# Patient Record
Sex: Female | Born: 1935 | Race: White | Hispanic: No | Smoking: Never smoker
Health system: Southern US, Community
[De-identification: ages and names within clinical notes are randomized; demographics above are authoritative.]

## PROBLEM LIST (undated history)

## (undated) DIAGNOSIS — I4891 Unspecified atrial fibrillation: Secondary | ICD-10-CM

## (undated) DIAGNOSIS — F329 Major depressive disorder, single episode, unspecified: Secondary | ICD-10-CM

## (undated) DIAGNOSIS — K219 Gastro-esophageal reflux disease without esophagitis: Secondary | ICD-10-CM

## (undated) DIAGNOSIS — F32A Depression, unspecified: Secondary | ICD-10-CM

## (undated) DIAGNOSIS — E039 Hypothyroidism, unspecified: Secondary | ICD-10-CM

## (undated) DIAGNOSIS — I10 Essential (primary) hypertension: Secondary | ICD-10-CM

## (undated) DIAGNOSIS — I350 Nonrheumatic aortic (valve) stenosis: Secondary | ICD-10-CM

## (undated) DIAGNOSIS — I2510 Atherosclerotic heart disease of native coronary artery without angina pectoris: Secondary | ICD-10-CM

## (undated) DIAGNOSIS — R296 Repeated falls: Secondary | ICD-10-CM

## (undated) DIAGNOSIS — I639 Cerebral infarction, unspecified: Secondary | ICD-10-CM

## (undated) DIAGNOSIS — E079 Disorder of thyroid, unspecified: Secondary | ICD-10-CM

## (undated) DIAGNOSIS — N390 Urinary tract infection, site not specified: Secondary | ICD-10-CM

## (undated) HISTORY — PX: TONSILLECTOMY: SUR1361

## (undated) HISTORY — PX: CORONARY ANGIOPLASTY WITH STENT PLACEMENT: SHX49

## (undated) HISTORY — PX: ABDOMINAL HYSTERECTOMY: SHX81

---

## 2016-02-12 ENCOUNTER — Emergency Department (HOSPITAL_COMMUNITY): Payer: Medicare Other

## 2016-02-12 ENCOUNTER — Emergency Department (HOSPITAL_COMMUNITY)
Admission: EM | Admit: 2016-02-12 | Discharge: 2016-02-12 | Disposition: A | Discharge status: Home / Self Care | Payer: Medicare Other | Source: Home / Self Care | Attending: Emergency Medicine | Admitting: Emergency Medicine

## 2016-02-12 DIAGNOSIS — Z7901 Long term (current) use of anticoagulants: Secondary | ICD-10-CM | POA: Insufficient documentation

## 2016-02-12 DIAGNOSIS — S0181XA Laceration without foreign body of other part of head, initial encounter: Secondary | ICD-10-CM | POA: Insufficient documentation

## 2016-02-12 DIAGNOSIS — W19XXXA Unspecified fall, initial encounter: Secondary | ICD-10-CM

## 2016-02-12 DIAGNOSIS — S0990XA Unspecified injury of head, initial encounter: Secondary | ICD-10-CM

## 2016-02-12 DIAGNOSIS — Y939 Activity, unspecified: Secondary | ICD-10-CM

## 2016-02-12 DIAGNOSIS — Y92129 Unspecified place in nursing home as the place of occurrence of the external cause: Secondary | ICD-10-CM | POA: Insufficient documentation

## 2016-02-12 DIAGNOSIS — R296 Repeated falls: Secondary | ICD-10-CM

## 2016-02-12 DIAGNOSIS — Y999 Unspecified external cause status: Secondary | ICD-10-CM | POA: Insufficient documentation

## 2016-02-12 DIAGNOSIS — S0191XA Laceration without foreign body of unspecified part of head, initial encounter: Secondary | ICD-10-CM

## 2016-02-12 HISTORY — DX: Repeated falls: R29.6

## 2016-02-12 LAB — BASIC METABOLIC PANEL
ANION GAP: 4 — AB (ref 5–15)
BUN: 15 mg/dL (ref 6–20)
CHLORIDE: 105 mmol/L (ref 101–111)
CO2: 28 mmol/L (ref 22–32)
Calcium: 9.2 mg/dL (ref 8.9–10.3)
Creatinine, Ser: 0.93 mg/dL (ref 0.44–1.00)
GFR calc non Af Amer: 52 mL/min — ABNORMAL LOW (ref >60)
GFR, EST AFRICAN AMERICAN: 60 mL/min — AB (ref >60)
Glucose, Bld: 118 mg/dL — ABNORMAL HIGH (ref 65–99)
POTASSIUM: 3.8 mmol/L (ref 3.5–5.1)
SODIUM: 137 mmol/L (ref 135–145)

## 2016-02-12 LAB — CBC
HEMATOCRIT: 37.3 % (ref 36.0–46.0)
Hemoglobin: 12.2 g/dL (ref 12.0–15.0)
MCH: 31.3 pg (ref 26.0–34.0)
MCHC: 32.7 g/dL (ref 30.0–36.0)
MCV: 95.6 fL (ref 78.0–100.0)
Platelets: 247 10*3/uL (ref 150–400)
RBC: 3.9 MIL/uL (ref 3.87–5.11)
RDW: 13.4 % (ref 11.5–15.5)
WBC: 9.9 10*3/uL (ref 4.0–10.5)

## 2016-02-12 LAB — I-STAT TROPONIN, ED: Troponin i, poc: 0.02 ng/mL (ref 0.00–0.08)

## 2016-02-12 MED ORDER — LIDOCAINE-EPINEPHRINE (PF) 2 %-1:200000 IJ SOLN
10.0000 mL | Freq: Once | INTRAMUSCULAR | Status: AC
  Administered 2016-02-12: 10 mL via INTRADERMAL
  Filled 2016-02-12: qty 20

## 2016-02-12 MED ORDER — ACETAMINOPHEN 325 MG PO TABS
650.0000 mg | ORAL_TABLET | Freq: Once | ORAL | Status: AC
  Administered 2016-02-12: 650 mg via ORAL
  Filled 2016-02-12: qty 2

## 2016-02-12 MED ORDER — ONDANSETRON HCL 4 MG/2ML IJ SOLN
2.0000 mg | Freq: Once | INTRAMUSCULAR | Status: AC
  Administered 2016-02-12: 2 mg via INTRAVENOUS
  Filled 2016-02-12: qty 2

## 2016-02-12 NOTE — Discharge Instructions (Signed)
Return for reevaluation if worsening headache or change in mental status. Please check on patient every 1-2 hours. Staples out of laceration in one week.

## 2016-02-12 NOTE — ED Triage Notes (Signed)
Pt here from nursing home with c/o fall , pt had lac to the back right of her head and left hip pain but landed onm the right side  No loc

## 2016-02-12 NOTE — ED Notes (Signed)
Pt able to stand with max assist , daughter states that is the pt.'s normal

## 2016-02-12 NOTE — ED Notes (Signed)
Pt transported to xray 

## 2016-02-12 NOTE — ED Notes (Signed)
Cleaned wound on back of head. Lac aprox. 2cm.

## 2016-02-12 NOTE — ED Provider Notes (Signed)
[facility]-EMERGENCY DEPT Provider Note   CSN: [id number] Arrival date & time: 02/12/16  1028     History   Chief Complaint Chief Complaint  Patient presents with  . Fall    HPI Mindy Baker is a 82 y.o. female.  HPI  82 year old female with unwitnessed fall at assisted living care facility. Daughter reports did not fall the previous assisted living care facility. [facility] at [facility]. Patient states that she does not remember what happened. She was found on the ground. She has a laceration to back of her head and bleeding is noted here. Patient complains of some back pain. She has a history of chronic back pain. Daughter is also present at bedside and states that she has recently had a pelvic fracture.  No past medical history on file.  There are no active problems to display for this patient.   No past surgical history on file.  OB History    No data available       Home Medications    Prior to Admission medications   Not on File    Family History No family history on file.  Social History Social History  Substance Use Topics  . Smoking status: Not on file  . Smokeless tobacco: Not on file  . Alcohol use Not on file     Allergies   Patient has no allergy information on record.   Review of Systems Review of Systems  All other systems reviewed and are negative.    Physical Exam Updated Vital Signs BP 144/86 (BP Location: Right Arm)   Pulse 88   Temp 97.4 F (36.3 C) (Oral)   Resp 23   SpO2 100%   Physical Exam  Constitutional: She appears well-developed and well-nourished.  HENT:  Head: Normocephalic.  Bleeding from back of head  Eyes: EOM are normal. Pupils are equal, round, and reactive to light.  Neck: Normal range of motion.  Cardiovascular: Normal rate.   Nursing note and vitals reviewed.    ED Treatments / Results  Labs (all labs ordered are listed, but only abnormal results are displayed) Labs Reviewed  CBC  BASIC  METABOLIC PANEL  I-STAT TROPOININ, ED    EKG  EKG Interpretation  Date/Time:  Monday February 12 2016 10:35:27 EST Ventricular Rate:  100 PR Interval:    QRS Duration: 80 QT Interval:  402 QTC Calculation: 503 R Axis:   -34 Text Interpretation:  Atrial fibrillation Inferior infarct, old Anteroseptal infarct, old Lateral leads are also involved Prolonged QT interval Baseline wander in lead(s) V6 No old tracing to compare Confirmed by Aramis Zobel MD, Duwayne HeckANIELLE [phone]) on 02/12/2016 11:26:29 AM       Radiology No results found.  Procedures Procedures (including critical care time)  Medications Ordered in ED Medications - No data to display   Initial Impression / Assessment and Plan / ED Course  I have reviewed the triage vital signs and the nursing notes.  Pertinent labs & imaging results that were available during my care of the patient were reviewed by me and considered in my medical decision making (see chart for details).  Clinical Course     Patient transfers only.  Discussed return precautions with daughter and will place on d/c instructions.   Final Clinical Impressions(s) / ED Diagnoses   Final diagnoses:  Fall, initial encounter  Traumatic head injury with multiple lacerations, initial encounter  Chronic anticoagulation    New Prescriptions New Prescriptions   No medications on file  Margarita Grizzleanielle Joel Cowin, MD 02/12/16 [phone]

## 2016-02-12 NOTE — ED Notes (Signed)
Awaiting ptar 

## 2016-02-13 ENCOUNTER — Emergency Department (HOSPITAL_COMMUNITY): Payer: Medicare Other

## 2016-02-13 ENCOUNTER — Inpatient Hospital Stay (HOSPITAL_COMMUNITY)
Admission: EM | Admit: 2016-02-13 | Discharge: 2016-02-17 | DRG: 872 | Disposition: A | Discharge status: Skilled Nursing Facility | Payer: Medicare Other | Attending: Internal Medicine | Admitting: Internal Medicine

## 2016-02-13 ENCOUNTER — Encounter (HOSPITAL_COMMUNITY): Payer: Self-pay | Admitting: Emergency Medicine

## 2016-02-13 DIAGNOSIS — N390 Urinary tract infection, site not specified: Secondary | ICD-10-CM | POA: Diagnosis present

## 2016-02-13 DIAGNOSIS — W19XXXA Unspecified fall, initial encounter: Secondary | ICD-10-CM | POA: Diagnosis present

## 2016-02-13 DIAGNOSIS — I4891 Unspecified atrial fibrillation: Secondary | ICD-10-CM | POA: Diagnosis present

## 2016-02-13 DIAGNOSIS — A419 Sepsis, unspecified organism: Secondary | ICD-10-CM | POA: Diagnosis not present

## 2016-02-13 DIAGNOSIS — Z9861 Coronary angioplasty status: Secondary | ICD-10-CM

## 2016-02-13 DIAGNOSIS — Z66 Do not resuscitate: Secondary | ICD-10-CM | POA: Diagnosis present

## 2016-02-13 DIAGNOSIS — Z7901 Long term (current) use of anticoagulants: Secondary | ICD-10-CM

## 2016-02-13 DIAGNOSIS — Z8673 Personal history of transient ischemic attack (TIA), and cerebral infarction without residual deficits: Secondary | ICD-10-CM

## 2016-02-13 DIAGNOSIS — N3000 Acute cystitis without hematuria: Secondary | ICD-10-CM | POA: Diagnosis present

## 2016-02-13 DIAGNOSIS — M436 Torticollis: Secondary | ICD-10-CM | POA: Diagnosis present

## 2016-02-13 DIAGNOSIS — W1830XA Fall on same level, unspecified, initial encounter: Secondary | ICD-10-CM | POA: Diagnosis present

## 2016-02-13 DIAGNOSIS — M810 Age-related osteoporosis without current pathological fracture: Secondary | ICD-10-CM | POA: Diagnosis present

## 2016-02-13 DIAGNOSIS — B958 Unspecified staphylococcus as the cause of diseases classified elsewhere: Secondary | ICD-10-CM | POA: Diagnosis present

## 2016-02-13 DIAGNOSIS — I10 Essential (primary) hypertension: Secondary | ICD-10-CM | POA: Diagnosis present

## 2016-02-13 DIAGNOSIS — F0390 Unspecified dementia without behavioral disturbance: Secondary | ICD-10-CM | POA: Diagnosis present

## 2016-02-13 DIAGNOSIS — I350 Nonrheumatic aortic (valve) stenosis: Secondary | ICD-10-CM | POA: Diagnosis present

## 2016-02-13 DIAGNOSIS — Y92099 Unspecified place in other non-institutional residence as the place of occurrence of the external cause: Secondary | ICD-10-CM

## 2016-02-13 DIAGNOSIS — Z955 Presence of coronary angioplasty implant and graft: Secondary | ICD-10-CM

## 2016-02-13 DIAGNOSIS — R51 Headache: Secondary | ICD-10-CM | POA: Diagnosis present

## 2016-02-13 DIAGNOSIS — I2510 Atherosclerotic heart disease of native coronary artery without angina pectoris: Secondary | ICD-10-CM

## 2016-02-13 HISTORY — DX: Repeated falls: R29.6

## 2016-02-13 HISTORY — DX: Major depressive disorder, single episode, unspecified: F32.9

## 2016-02-13 HISTORY — DX: Unspecified atrial fibrillation: I48.91

## 2016-02-13 HISTORY — DX: Urinary tract infection, site not specified: N39.0

## 2016-02-13 HISTORY — DX: Hypothyroidism, unspecified: E03.9

## 2016-02-13 HISTORY — DX: Gastro-esophageal reflux disease without esophagitis: K21.9

## 2016-02-13 HISTORY — DX: Nonrheumatic aortic (valve) stenosis: I35.0

## 2016-02-13 HISTORY — DX: Cerebral infarction, unspecified: I63.9

## 2016-02-13 HISTORY — DX: Depression, unspecified: F32.A

## 2016-02-13 HISTORY — DX: Atherosclerotic heart disease of native coronary artery without angina pectoris: I25.10

## 2016-02-13 HISTORY — DX: Essential (primary) hypertension: I10

## 2016-02-13 HISTORY — DX: Disorder of thyroid, unspecified: E07.9

## 2016-02-13 LAB — URINALYSIS, ROUTINE W REFLEX MICROSCOPIC
Bilirubin Urine: NEGATIVE
Glucose, UA: NEGATIVE mg/dL
Hgb urine dipstick: NEGATIVE
KETONES UR: 5 mg/dL — AB
Nitrite: POSITIVE — AB
PROTEIN: NEGATIVE mg/dL
Specific Gravity, Urine: 1.02 (ref 1.005–1.030)
pH: 5 (ref 5.0–8.0)

## 2016-02-13 LAB — COMPREHENSIVE METABOLIC PANEL
ALBUMIN: 3 g/dL — AB (ref 3.5–5.0)
ALK PHOS: 50 U/L (ref 38–126)
ALT: 13 U/L — AB (ref 14–54)
ANION GAP: 9 (ref 5–15)
AST: 27 U/L (ref 15–41)
BUN: 17 mg/dL (ref 6–20)
CALCIUM: 8.8 mg/dL — AB (ref 8.9–10.3)
CO2: 21 mmol/L — AB (ref 22–32)
CREATININE: 0.95 mg/dL (ref 0.44–1.00)
Chloride: 105 mmol/L (ref 101–111)
GFR calc non Af Amer: 50 mL/min — ABNORMAL LOW (ref >60)
GFR, EST AFRICAN AMERICAN: 58 mL/min — AB (ref >60)
GLUCOSE: 102 mg/dL — AB (ref 65–99)
Potassium: 4.1 mmol/L (ref 3.5–5.1)
SODIUM: 135 mmol/L (ref 135–145)
TOTAL PROTEIN: 5.6 g/dL — AB (ref 6.5–8.1)
Total Bilirubin: 1 mg/dL (ref 0.3–1.2)

## 2016-02-13 LAB — CBC WITH DIFFERENTIAL/PLATELET
Basophils Absolute: 0 10*3/uL (ref 0.0–0.1)
Basophils Relative: 0 %
EOS ABS: 0.2 10*3/uL (ref 0.0–0.7)
Eosinophils Relative: 2 %
HEMATOCRIT: 36.4 % (ref 36.0–46.0)
HEMOGLOBIN: 12 g/dL (ref 12.0–15.0)
LYMPHS ABS: 1.3 10*3/uL (ref 0.7–4.0)
LYMPHS PCT: 14 %
MCH: 31.8 pg (ref 26.0–34.0)
MCHC: 33 g/dL (ref 30.0–36.0)
MCV: 96.6 fL (ref 78.0–100.0)
MONOS PCT: 8 %
Monocytes Absolute: 0.7 10*3/uL (ref 0.1–1.0)
NEUTROS ABS: 7.4 10*3/uL (ref 1.7–7.7)
NEUTROS PCT: 76 %
Platelets: 228 10*3/uL (ref 150–400)
RBC: 3.77 MIL/uL — AB (ref 3.87–5.11)
RDW: 13.5 % (ref 11.5–15.5)
WBC: 9.6 10*3/uL (ref 4.0–10.5)

## 2016-02-13 LAB — LIPASE, BLOOD: Lipase: 17 U/L (ref 11–51)

## 2016-02-13 MED ORDER — NITROGLYCERIN 0.4 MG SL SUBL
0.4000 mg | SUBLINGUAL_TABLET | SUBLINGUAL | Status: DC | PRN
Start: 2016-02-13 — End: 2016-02-17

## 2016-02-13 MED ORDER — VITAMIN D 1000 UNITS PO TABS
1000.0000 [IU] | ORAL_TABLET | Freq: Every day | ORAL | Status: DC
  Administered 2016-02-14 – 2016-02-17 (×4): 1000 [IU] via ORAL
  Filled 2016-02-13 (×4): qty 1

## 2016-02-13 MED ORDER — CALCIUM CARBONATE-VITAMIN D 500-200 MG-UNIT PO TABS
1.0000 | ORAL_TABLET | Freq: Every day | ORAL | Status: DC
  Administered 2016-02-14 – 2016-02-15 (×2): 1 via ORAL
  Filled 2016-02-13 (×2): qty 1

## 2016-02-13 MED ORDER — POLYETHYLENE GLYCOL 3350 17 G PO PACK
17.0000 g | PACK | Freq: Every day | ORAL | Status: DC
  Administered 2016-02-14 – 2016-02-17 (×4): 17 g via ORAL
  Filled 2016-02-13 (×4): qty 1

## 2016-02-13 MED ORDER — ADULT MULTIVITAMIN W/MINERALS CH
1.0000 | ORAL_TABLET | Freq: Every day | ORAL | Status: DC
  Administered 2016-02-14 – 2016-02-15 (×2): 1 via ORAL
  Filled 2016-02-13 (×2): qty 1

## 2016-02-13 MED ORDER — ACETAMINOPHEN 500 MG PO TABS
500.0000 mg | ORAL_TABLET | Freq: Three times a day (TID) | ORAL | Status: DC | PRN
  Administered 2016-02-13 – 2016-02-17 (×5): 500 mg via ORAL
  Filled 2016-02-13 (×7): qty 1

## 2016-02-13 MED ORDER — LEVOTHYROXINE SODIUM 50 MCG PO TABS
50.0000 ug | ORAL_TABLET | Freq: Every day | ORAL | Status: DC
  Administered 2016-02-13 – 2016-02-17 (×5): 50 ug via ORAL
  Filled 2016-02-13 (×5): qty 1

## 2016-02-13 MED ORDER — SENNOSIDES-DOCUSATE SODIUM 8.6-50 MG PO TABS
1.0000 | ORAL_TABLET | Freq: Every day | ORAL | Status: DC
  Administered 2016-02-14 – 2016-02-17 (×4): 1 via ORAL
  Filled 2016-02-13 (×4): qty 1

## 2016-02-13 MED ORDER — CLOPIDOGREL BISULFATE 75 MG PO TABS
75.0000 mg | ORAL_TABLET | Freq: Every day | ORAL | Status: DC
  Administered 2016-02-13 – 2016-02-17 (×5): 75 mg via ORAL
  Filled 2016-02-13 (×5): qty 1

## 2016-02-13 MED ORDER — VITAMIN B-12 100 MCG PO TABS
500.0000 ug | ORAL_TABLET | Freq: Every day | ORAL | Status: DC
  Administered 2016-02-14 – 2016-02-17 (×4): 500 ug via ORAL
  Filled 2016-02-13 (×4): qty 5

## 2016-02-13 MED ORDER — DILTIAZEM HCL ER COATED BEADS 180 MG PO CP24
180.0000 mg | ORAL_CAPSULE | Freq: Every day | ORAL | Status: DC
  Administered 2016-02-13 – 2016-02-17 (×5): 180 mg via ORAL
  Filled 2016-02-13 (×7): qty 1

## 2016-02-13 MED ORDER — DEXTROSE 5 % IV SOLN
1.0000 g | Freq: Once | INTRAVENOUS | Status: AC
  Administered 2016-02-13: 1 g via INTRAVENOUS
  Filled 2016-02-13: qty 10

## 2016-02-13 MED ORDER — SODIUM CHLORIDE 0.9 % IV BOLUS (SEPSIS)
1000.0000 mL | Freq: Once | INTRAVENOUS | Status: AC
  Administered 2016-02-13: 1000 mL via INTRAVENOUS

## 2016-02-13 MED ORDER — DEXTROSE 5 % IV SOLN
1.0000 g | INTRAVENOUS | Status: DC
  Administered 2016-02-14 – 2016-02-15 (×2): 1 g via INTRAVENOUS
  Filled 2016-02-13 (×3): qty 10

## 2016-02-13 MED ORDER — SODIUM CHLORIDE 0.9 % IV SOLN: INTRAVENOUS | Status: AC

## 2016-02-13 MED ORDER — RIVAROXABAN 15 MG PO TABS
15.0000 mg | ORAL_TABLET | Freq: Every day | ORAL | Status: DC
  Administered 2016-02-13 – 2016-02-16 (×4): 15 mg via ORAL
  Filled 2016-02-13 (×4): qty 1

## 2016-02-13 NOTE — ED Notes (Signed)
Clean patient up pt bed was wet

## 2016-02-13 NOTE — ED Notes (Signed)
Patient transported to X-ray 

## 2016-02-13 NOTE — H&P (Signed)
Date: 02/13/2016               Patient Name:  Mindy Baker MRN: [id number]  DOB: 08/09/1921 Age / Sex: 82 y.o., female   PCP: Florentina JennyHenry Tripp, MD         Medical Service: Internal Medicine Teaching Service         Attending Physician: Dr. Gust RungErik C Hoffman, DO    First Contact: Dr. Thomasene LotJames Dominyk Law Pager: [phone]  Second Contact: Dr. Reggie PileVasu Rathore Pager: [phone]       After Hours (After 5p/  First Contact Pager: [phone]  weekends / holidays): Second Contact Pager: [phone]   Chief Complaint: Emesis  History of Present Illness: Mindy Baker is a 82 year old female with a medical history of atrial fibrillation on anticoagulation with Xarelto, hypertension, coronary artery disease status post stent, prior CVA and osteoporosis who presented from a nursing facility with emesis and decreased responsiveness. Importantly, the patient presented to the [facility] emergency department yesterday on 02/12/2016 after an unwitnessed fall at assisted living facility. Speaking with her today she said she tripped over her feet but did not feel dizzy or lightheaded before the fall. She did not lose consciousness. She did hit her head and had a laceration which was repaired in the emergency department yesterday. She had multiple imaging studies including a CT head without contrast which showed advanced atrophy with chronic microvascular ischemia and no acute intracranial abnormality, CT cervical spine showed no acute abnormality, CT thoracic spine showed no fracture and was negative for acute abnormality, radiograph of the chest showed no acute fractures, pelvic radiograph demonstrated deformity and sclerosis noted on the left pubis that was related to an old fracture. At this prior admission her head laceration was treated and she was discharged with pain medication. She returns today from her facility as was noted to have episodes of vomiting and a headache this morning. In the emergency department today a chest x-ray showed  mild chronic interstitial changes and a possible atelectasis or pneumonia in the right upper lobe. A repeat CT head without contrast showed no acute finding or change from prior imaging modality. The patient denied headache, nausea, vomiting or abdominal pain. She denied chest pain. She endorsed mild shortness of breath and midline back pain. We were unable to obtain additional history from the patient. Her daughters were present and said that their understanding was the patient developed a headache and was throwing up this morning which prompted her return to the emergency department.   Laboratory evaluation in the emergency department revealed the following: CBC was within normal limits, CMP demonstrated an albumin of 3.0 and a total protein of 5.6, urinalysis was positive for nitrite and demonstrated small leukocytes. She was given ceftriaxone and admitted to the [facility] internal medicine teaching service for further management and evaluation.  Meds:  Current Meds  Medication Sig  . aspirin EC 81 MG tablet Take 81 mg by mouth daily.  . Calcium-Vitamin D 600-200 MG-UNIT tablet Take 1 tablet by mouth daily.  . cholecalciferol (VITAMIN D) 1000 units tablet Take 1,000 Units by mouth daily.  . clopidogrel (PLAVIX) 75 MG tablet Take 75 mg by mouth daily.  Marland Kitchen. diltiazem (DILACOR XR) 180 MG 24 hr capsule Take 180 mg by mouth daily.  Marland Kitchen. levothyroxine (SYNTHROID, LEVOTHROID) 50 MCG tablet Take 50 mcg by mouth daily before breakfast.  . lisinopril (PRINIVIL,ZESTRIL) 5 MG tablet Take 5 mg by mouth daily.  . Multiple Vitamin (MULTIVITAMIN WITH MINERALS)  TABS tablet Take 1 tablet by mouth daily.  . polyethylene glycol (MIRALAX / GLYCOLAX) packet Take 17 g by mouth daily.  . Rivaroxaban (XARELTO) 15 MG TABS tablet Take 15 mg by mouth daily with supper.  . senna-docusate (SENOKOT-S) 8.6-50 MG tablet Take 1 tablet by mouth daily.  . vitamin B-12 (CYANOCOBALAMIN) 500 MCG tablet Take 500 mcg by mouth daily.      Allergies: Allergies as of 02/13/2016 - Review Complete 02/13/2016  Allergen Reaction Noted  . Ampicillin Other (See Comments) 02/12/2016  . Codeine Other (See Comments) 02/12/2016  . Statins Other (See Comments) 02/12/2016   Past Medical History:  Diagnosis Date  . Atrial fibrillation (HCC)   . Hypertension   . Thyroid disease     Review of Systems: A complete ROS was negative except as per HPI.   Physical Exam: Blood pressure 157/82, pulse 112, temperature 98.4 F (36.9 C), temperature source Oral, resp. rate (!) 29, SpO2 91 %. Physical Exam  Constitutional: She is oriented to person, place, and time.  Appears frail  HENT:  Her head was turned to the right and she would not straighten her head past midline. She has a dark melanotic appearing lesion in opening of her left external auditory canal. Oral mucosa appeared dry.  Cardiovascular:  Murmur heard. Grade 5/6 holosystolic murmur heard best at the right upper sternal border, irregular rhythm  Respiratory: Effort normal and breath sounds normal. No respiratory distress. She has no wheezes.  GI: Soft. Bowel sounds are normal. She exhibits no distension. There is no tenderness.  Musculoskeletal: She exhibits no edema.  Neurological: She is alert and oriented to person, place, and time.  Skin: Skin is dry.     EKG: Atrial fibrillation  CXR: Mild chronic interstitial changes. Possible atelectasis or pneumonia in the right upper lobe. Cardiomegaly without evidence of congestive heart failure exacerbation.  Assessment & Plan by Problem: Active Problems:   UTI (urinary tract infection)   Atrial fibrillation (HCC)   Hypertension   Aortic stenosis, severe   CAD S/P percutaneous coronary angioplasty  [name] is a 82 year old frail appearing female with a medical history of atrial fibrillation on anticoagulation, hypertension, severe aortic stenosis, osteoporosis and coronary artery disease status post stent who  presents one day after a fall with headache, emesis and lethargy.   # Headache, emesis and lethargy The patient presents one day after unwitnessed fall with new headache, emesis and lethargy. CT scan performed in the emergency department showed no evidence of intracranial pathology. Laboratory evaluation was mostly unremarkable. Urinalysis showed nitrites and leukocytes consistent with a urinary tract infection. Additionally, the patient appeared dry on examination and has not eaten in the past several days. The exact etiology of the patient's presentation is unclear however the differential diagnosis includes poor by mouth intake, urinary tract infection or possible pneumonia. Given the patient is afebrile and does not have leukocytosis I am less concerned about the potential for pneumonia. At this time we will treat the patient with IV hydration and antibiotics for a urinary tract infection and monitor for clinical improvement. -- Ceftriaxone -- Follow-up urine culture -- Normal saline at 50 mL an hour -- Physical therapy consult -- daily neurological examination  # Atrial fibrillation ## Aortic Stenosis  ## Coronary artery disease status post stent The patient has a history of atrial fibrillation which she is anticoagulated with Xarelto. Additionally, the patient has a history of coronary artery disease and is status post stent. She is currently on  DAPT following her angioplasty and stent. She was in atrial fibrillation on examination today. Her MAPs were appropriate. -- Xarelto 15 mg daily -- Clopidogrel 75 mg once daily, will hold aspirin for now -- Rate controlled with diltiazem 180 mg extended release  # Osteoporosis The patient's daughter states that she had a fall approximately one year ago and suffered a pelvic fracture. Given that the patient has suffered a fragility fracture she meets criteria to be diagnosed with osteoporosis. She may benefit from bisphosphonate therapy while in the  inpatient setting -- Consider bisphosphonate  # Hypertension Currently, the patient is not hypertensive. Additionally, given her recent fall we will hold her lisinopril at this time. We will resume if she becomes hypertensive. -- Hold home lisinopril  DVT/PE prophylaxis: Therapeutically anticoagulated with Xarelto FEN/GI: Thin Code: DO NOT RESUSCITATE  Dispo: Admit patient to Observation with expected length of stay less than 2 midnights.  Signed: Thomasene Lot, MD 02/13/2016, 2:09 PM  Pager: [phone]

## 2016-02-13 NOTE — ED Triage Notes (Signed)
Per EMS: pt from [facility] c/o N/V x 1 this am and seen yesterday for fall; pt c/o neck pain; pt denies complaints at present

## 2016-02-13 NOTE — ED Provider Notes (Addendum)
[facility]Y DEPT Provider Note   CSN: [id number] Arrival date & time: 02/13/16  4098     History   Chief Complaint Chief Complaint  Patient presents with  . Emesis    HPI Mindy Baker is a 82 y.o. female.  82 year old female who arrives today with vomiting. She was seen yesterday after a fall where she had had neck and back pain. She had a CT of her head neck thoracic and some x-rays done all of which were negative. She is discharged back to facility. Patient arrives today after one episode of vomiting. Unsure if there is any blood or bilious material in it. No fevers, no abdominal pain, no urinary symptoms. No chest pain or shortness of breath. She had a little bit of neck pain earlier today but none on my exam. She does seem slightly confused however.    LEVEL V CAVEAT 2/2 DEMENTIA and CONFUSION  Past Medical History:  Diagnosis Date  . Atrial fibrillation (HCC)   . Hypertension   . Thyroid disease     There are no active problems to display for this patient.   History reviewed. No pertinent surgical history.  OB History    No data available       Home Medications    Prior to Admission medications   Medication Sig Start Date End Date Taking? Authorizing Provider  acetaminophen (TYLENOL) 500 MG tablet Take 500 mg by mouth every 8 (eight) hours as needed for moderate pain.    Historical Provider, MD  aspirin EC 81 MG tablet Take 81 mg by mouth daily.    Historical Provider, MD  Calcium-Vitamin D 600-200 MG-UNIT tablet Take 1 tablet by mouth daily.    Historical Provider, MD  cholecalciferol (VITAMIN D) 1000 units tablet Take 1,000 Units by mouth daily.    Historical Provider, MD  clopidogrel (PLAVIX) 75 MG tablet Take 75 mg by mouth daily.    Historical Provider, MD  diltiazem (DILACOR XR) 180 MG 24 hr capsule Take 180 mg by mouth daily.    Historical Provider, MD  ENSURE (ENSURE) Take 237 mLs by mouth 3 (three) times daily between meals.    Historical Provider,  MD  levothyroxine (SYNTHROID, LEVOTHROID) 50 MCG tablet Take 50 mcg by mouth daily before breakfast.    Historical Provider, MD  lisinopril (PRINIVIL,ZESTRIL) 5 MG tablet Take 5 mg by mouth daily.    Historical Provider, MD  Multiple Vitamin (MULTIVITAMIN WITH MINERALS) TABS tablet Take 1 tablet by mouth daily.    Historical Provider, MD  nitroGLYCERIN (NITROSTAT) 0.4 MG SL tablet Place 0.4 mg under the tongue every 5 (five) minutes as needed for chest pain.    Historical Provider, MD  polyethylene glycol (MIRALAX / GLYCOLAX) packet Take 17 g by mouth daily.    Historical Provider, MD  Rivaroxaban (XARELTO) 15 MG TABS tablet Take 15 mg by mouth daily with supper.    Historical Provider, MD  senna-docusate (SENOKOT-S) 8.6-50 MG tablet Take 1 tablet by mouth daily.    Historical Provider, MD  vitamin B-12 (CYANOCOBALAMIN) 500 MCG tablet Take 500 mcg by mouth daily.    Historical Provider, MD    Family History History reviewed. No pertinent family history.  Social History Social History  Substance Use Topics  . Smoking status: Never Smoker  . Smokeless tobacco: Never Used  . Alcohol use No     Allergies   Ampicillin; Codeine; and Statins   Review of Systems Review of Systems  All other systems reviewed  and are negative.    Physical Exam Updated Vital Signs BP 130/92 (BP Location: Right Arm)   Pulse 96   Temp 98.2 F (36.8 C) (Axillary) Comment (Src): pt was given ice upon arrival  Resp 21   SpO2 96%   Physical Exam  Constitutional: She is oriented to person, place, and time. She appears well-developed and well-nourished.  HENT:  Head: Normocephalic and atraumatic.  Neck: Normal range of motion.  Cardiovascular: Normal rate and regular rhythm.   Pulmonary/Chest: Effort normal. No stridor. No respiratory distress.  Abdominal: Soft. She exhibits no distension.  Musculoskeletal: Normal range of motion. She exhibits no edema or deformity.  Neurological: She is alert and  oriented to person, place, and time.  On my exam she is oriented to self and understands that she lives in a nursing facility but does not know what year it is that she thinks is [id number]. She does have a left facial droop involving her mouth but no other portions of cranial nerves. Her strength in her upper or lower extremities seems to be symmetric. Her sensation is symmetric as well and upper lower extremity's. Patient not able cooperate well with finger to nose. Did not attempt to ambulate at this time.  Skin: Skin is warm and dry. No erythema. No pallor.  Nursing note and vitals reviewed.    ED Treatments / Results  Labs (all labs ordered are listed, but only abnormal results are displayed) Labs Reviewed  CBC WITH DIFFERENTIAL/PLATELET - Abnormal; Notable for the following:       Result Value   RBC 3.77 (*)    All other components within normal limits  COMPREHENSIVE METABOLIC PANEL - Abnormal; Notable for the following:    CO2 21 (*)    Glucose, Bld 102 (*)    Calcium 8.8 (*)    Total Protein 5.6 (*)    Albumin 3.0 (*)    ALT 13 (*)    GFR calc non Af Amer 50 (*)    GFR calc Af Amer 58 (*)    All other components within normal limits  URINALYSIS, ROUTINE W REFLEX MICROSCOPIC - Abnormal; Notable for the following:    Color, Urine AMBER (*)    APPearance CLOUDY (*)    Ketones, ur 5 (*)    Nitrite POSITIVE (*)    Leukocytes, UA SMALL (*)    Bacteria, UA MANY (*)    Squamous Epithelial / LPF 0-5 (*)    All other components within normal limits  BASIC METABOLIC PANEL - Abnormal; Notable for the following:    Sodium 133 (*)    CO2 20 (*)    Glucose, Bld 125 (*)    Calcium 8.2 (*)    All other components within normal limits  CBC - Abnormal; Notable for the following:    WBC 10.9 (*)    RBC 3.58 (*)    Hemoglobin 11.1 (*)    HCT 34.1 (*)    All other components within normal limits  MRSA PCR SCREENING  URINE CULTURE  LIPASE, BLOOD    EKG  EKG Interpretation None        Radiology Dg Chest 1 View  Result Date: 02/12/2016 CLINICAL DATA:  Head injury. Fall. Confusion. Left posterior hip pain. EXAM: CHEST 1 VIEW COMPARISON:  Thoracic spine CT from today FINDINGS: Peripheral nodular density in the right upper lobe corresponding to the findings on thoracic spine CT. Old healed rib fractures bilaterally. Atherosclerotic aortic arch. Coronary stent. Mild enlargement of  the cardiopericardial silhouette. Mild interstitial accentuation. Probable right hemidiaphragmatic eventration. Multiple thoracic vertebral augmentations. IMPRESSION: 1. Moderate enlargement of the cardiopericardial silhouette, without overt edema. 2. Old bilateral rib fractures. 3. Vague nodular density in the right upper lobe, as shown on recent CT of the thoracic spine. Malignancy not excluded. 4. Thoracic vertebral augmentations. 5. Atherosclerotic aortic arch. Electronically Signed   By: Gaylyn RongWalter  Liebkemann M.D.   On: 02/12/2016 14:02   Dg Pelvis 1-2 Views  Result Date: 02/12/2016 CLINICAL DATA:  Fall . EXAM: PELVIS - 1-2 VIEW COMPARISON:  No prior. FINDINGS: Degenerative changes lumbar spine and both hips. Deformity with sclerosis noted of the left pubis. This could be related to an old fracture. Superimposed acute fracture cannot be excluded . A blastic metastatic focus cannot be excluded. Peripheral vascular calcification. IMPRESSION: 1. Deformity and scleroses noted of left pubis. This could be related to an old fracture. Superimposed acute fracture can't be excluded. Blastic metastatic focus cannot be excluded. 2. Degenerative changes lumbar spine and both hips. 3. Peripheral vascular disease . Electronically Signed   By: Maisie Fushomas  Register   On: 02/12/2016 14:01   Ct Head Wo Contrast  Result Date: 02/12/2016 CLINICAL DATA:  Fall.  Head injury.  Anticoagulation. EXAM: CT HEAD WITHOUT CONTRAST CT CERVICAL SPINE WITHOUT CONTRAST TECHNIQUE: Multidetector CT imaging of the head and cervical spine was  performed following the standard protocol without intravenous contrast. Multiplanar CT image reconstructions of the cervical spine were also generated. COMPARISON:  None. FINDINGS: CT HEAD FINDINGS Brain: Advanced atrophy. Chronic microvascular ischemic change throughout the cerebral white matter. Negative for acute infarct. Negative for hemorrhage or mass. 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 Vascular: Carotid artery calcification.  Negative for dense MCA. Skull: Negative for skull fracture. Bilateral nasal bone fracture of indeterminate age. Sinuses/Orbits: Mucosal edema in the paranasal sinuses without air-fluid level. Bilateral lens replacement. Other: None CT CERVICAL SPINE FINDINGS Alignment: Retrolisthesis of C3 on C4. Mild anterolisthesis C5 on C6. Skull base and vertebrae: Negative for fracture. 8 mm sclerotic lesion C7 vertebral body on the right is indeterminate. Soft tissues and spinal canal: Negative for soft tissue mass. Bilateral carotid artery calcification Disc levels: Mild disc and facet degeneration at C3-4 and C4-5. Anterolisthesis and mild spurring at C5-6. Mild spurring at C6-7. Upper chest: Negative Other: None IMPRESSION: Advanced atrophy with chronic microvascular ischemia. No acute intracranial abnormality Cervical spine degenerative change.  No acute fracture 8 mm sclerotic lesion C7 vertebral body is indeterminate but may represent a bone island. Electronically Signed   By: Marlan Palauharles  Clark M.D.   On: 02/12/2016 12:30   Ct Cervical Spine Wo Contrast  Result Date: 02/12/2016 CLINICAL DATA:  Fall.  Head injury.  Anticoagulation. EXAM: CT HEAD WITHOUT CONTRAST CT CERVICAL SPINE WITHOUT CONTRAST TECHNIQUE: Multidetector CT imaging of the head and cervical spine was performed following the standard protocol without intravenous contrast. Multiplanar CT image reconstructions of the cervical spine were also generated. COMPARISON:  None. FINDINGS: CT HEAD FINDINGS  Brain: Advanced atrophy. Chronic microvascular ischemic change throughout the cerebral white matter. Negative for acute infarct. Negative for hemorrhage or mass. 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 Vascular: Carotid artery calcification.  Negative for dense MCA. Skull: Negative for skull fracture. Bilateral nasal bone fracture of indeterminate age. Sinuses/Orbits: Mucosal edema in the paranasal sinuses without air-fluid level. Bilateral lens replacement. Other: None CT CERVICAL SPINE FINDINGS Alignment: Retrolisthesis of C3 on C4. Mild anterolisthesis C5 on C6. Skull base and vertebrae: Negative for fracture. 8 mm  sclerotic lesion C7 vertebral body on the right is indeterminate. Soft tissues and spinal canal: Negative for soft tissue mass. Bilateral carotid artery calcification Disc levels: Mild disc and facet degeneration at C3-4 and C4-5. Anterolisthesis and mild spurring at C5-6. Mild spurring at C6-7. Upper chest: Negative Other: None IMPRESSION: Advanced atrophy with chronic microvascular ischemia. No acute intracranial abnormality Cervical spine degenerative change.  No acute fracture 8 mm sclerotic lesion C7 vertebral body is indeterminate but may represent a bone island. Electronically Signed   By: Marlan Palau M.D.   On: 02/12/2016 12:30   Ct Thoracic Spine Wo Contrast  Result Date: 02/12/2016 CLINICAL DATA:  Fall.  Anti coagulation. EXAM: CT THORACIC SPINE WITHOUT CONTRAST TECHNIQUE: Multidetector CT images of the thoracic were obtained using the standard protocol without intravenous contrast. COMPARISON:  None. FINDINGS: Alignment: Normal alignment.  Accentuated dorsal kyphosis. Vertebrae: Chronic fractures of T6, T7, and T9 with vertebroplasty cement. No acute fracture. Paraspinal and other soft tissues: Paraspinous soft tissues normal. 2 cm subpleural mass lesion in the right upper lobe posteriorly. This could represent neoplasm or scarring. Disc levels: Mild thoracic  disc degeneration. No spinal stenosis or disc protrusion identified. IMPRESSION: Negative for acute fracture Chronic fractures with vertebroplasty at T6, T7, and T9 2 cm lesion in the right upper lobe posteriorly. This could represent scarring or infiltrate or mass lesion. CT chest recommended for further evaluation. Electronically Signed   By: Marlan Palau M.D.   On: 02/12/2016 12:35    Procedures Procedures (including critical care time)  Medications Ordered in ED Medications  sodium chloride 0.9 % bolus 1,000 mL (not administered)     Initial Impression / Assessment and Plan / ED Course  I have reviewed the triage vital signs and the nursing notes.  Pertinent labs & imaging results that were available during my care of the patient were reviewed by me and considered in my medical decision making (see chart for details).  Clinical Course    Will rule out abdominal/neurologic causes. Will get CT head/metabolic workup.   CT stable. UTI on labs. In association with vomiting, increased sleepiness and slight tachycardia, will admit for obs to ensure she improves. Rocephin started. tolerating PO fluids at this time.  Final Clinical Impressions(s) / ED Diagnoses   Final diagnoses:  Acute cystitis without hematuria    New Prescriptions New Prescriptions   No medications on file     Marily Memos, MD 02/14/16 0981    Marily Memos, MD 02/14/16 [phone]

## 2016-02-13 NOTE — ED Notes (Signed)
Social work at bedside to speak with family.

## 2016-02-13 NOTE — ED Notes (Signed)
Pt.s daughther stated doesnot walk at all .she just trans fers

## 2016-02-13 NOTE — Progress Notes (Signed)
CSW engaged with Patient's two daughters outside of Patient's room. Patient's daughters report that [facility] is presently living [facility]. Patient's daughters report that Patient has [employer] insurance and reports that any facility placement will be private pay. Patient's daughter's inquired about obtaining private sitter care at the Assisted Living Facility for their mother. They report that they are also interested in [facility] as well. Patient's daughters report they are not interested in nursing facility placement at this time. CSW provided Patient's family with list of private duty care agencies and Patient's daughters agreed to further discuss with [facility] as [facility] has a approved private duty care list as well that Patient's family must comply with. Patient's daughter's report that they will be going to meet with [facility] today to discuss obtaining more services. Family appreciative of CSW assistance. CSW signing off at this time. Please consult should new need(s) arise.     Enos FlingAshley Anzlee Hinesley, MSW, LCSW [facility] Clinical Social Worker [phone]

## 2016-02-14 DIAGNOSIS — M436 Torticollis: Secondary | ICD-10-CM | POA: Diagnosis present

## 2016-02-14 DIAGNOSIS — W19XXXA Unspecified fall, initial encounter: Secondary | ICD-10-CM | POA: Diagnosis present

## 2016-02-14 DIAGNOSIS — I10 Essential (primary) hypertension: Secondary | ICD-10-CM | POA: Diagnosis present

## 2016-02-14 DIAGNOSIS — I2510 Atherosclerotic heart disease of native coronary artery without angina pectoris: Secondary | ICD-10-CM

## 2016-02-14 DIAGNOSIS — Z955 Presence of coronary angioplasty implant and graft: Secondary | ICD-10-CM

## 2016-02-14 DIAGNOSIS — Y92099 Unspecified place in other non-institutional residence as the place of occurrence of the external cause: Secondary | ICD-10-CM | POA: Diagnosis not present

## 2016-02-14 DIAGNOSIS — N390 Urinary tract infection, site not specified: Secondary | ICD-10-CM | POA: Diagnosis not present

## 2016-02-14 DIAGNOSIS — Z66 Do not resuscitate: Secondary | ICD-10-CM

## 2016-02-14 DIAGNOSIS — W1830XA Fall on same level, unspecified, initial encounter: Secondary | ICD-10-CM | POA: Diagnosis present

## 2016-02-14 DIAGNOSIS — B957 Other staphylococcus as the cause of diseases classified elsewhere: Secondary | ICD-10-CM | POA: Diagnosis not present

## 2016-02-14 DIAGNOSIS — F0390 Unspecified dementia without behavioral disturbance: Secondary | ICD-10-CM | POA: Diagnosis present

## 2016-02-14 DIAGNOSIS — Z9181 History of falling: Secondary | ICD-10-CM

## 2016-02-14 DIAGNOSIS — I4891 Unspecified atrial fibrillation: Secondary | ICD-10-CM | POA: Diagnosis present

## 2016-02-14 DIAGNOSIS — Z7901 Long term (current) use of anticoagulants: Secondary | ICD-10-CM | POA: Diagnosis not present

## 2016-02-14 DIAGNOSIS — M4850XA Collapsed vertebra, not elsewhere classified, site unspecified, initial encounter for fracture: Secondary | ICD-10-CM

## 2016-02-14 DIAGNOSIS — B958 Unspecified staphylococcus as the cause of diseases classified elsewhere: Secondary | ICD-10-CM | POA: Diagnosis present

## 2016-02-14 DIAGNOSIS — M810 Age-related osteoporosis without current pathological fracture: Secondary | ICD-10-CM | POA: Diagnosis present

## 2016-02-14 DIAGNOSIS — A419 Sepsis, unspecified organism: Principal | ICD-10-CM

## 2016-02-14 DIAGNOSIS — B9689 Other specified bacterial agents as the cause of diseases classified elsewhere: Secondary | ICD-10-CM

## 2016-02-14 DIAGNOSIS — Z8781 Personal history of (healed) traumatic fracture: Secondary | ICD-10-CM

## 2016-02-14 DIAGNOSIS — Z8673 Personal history of transient ischemic attack (TIA), and cerebral infarction without residual deficits: Secondary | ICD-10-CM | POA: Diagnosis not present

## 2016-02-14 DIAGNOSIS — I350 Nonrheumatic aortic (valve) stenosis: Secondary | ICD-10-CM | POA: Diagnosis present

## 2016-02-14 DIAGNOSIS — N3000 Acute cystitis without hematuria: Secondary | ICD-10-CM | POA: Diagnosis present

## 2016-02-14 DIAGNOSIS — R51 Headache: Secondary | ICD-10-CM | POA: Diagnosis present

## 2016-02-14 LAB — CBC
HCT: 34.1 % — ABNORMAL LOW (ref 36.0–46.0)
Hemoglobin: 11.1 g/dL — ABNORMAL LOW (ref 12.0–15.0)
MCH: 31 pg (ref 26.0–34.0)
MCHC: 32.6 g/dL (ref 30.0–36.0)
MCV: 95.3 fL (ref 78.0–100.0)
PLATELETS: 208 10*3/uL (ref 150–400)
RBC: 3.58 MIL/uL — AB (ref 3.87–5.11)
RDW: 13.5 % (ref 11.5–15.5)
WBC: 10.9 10*3/uL — AB (ref 4.0–10.5)

## 2016-02-14 LAB — BASIC METABOLIC PANEL
Anion gap: 10 (ref 5–15)
BUN: 17 mg/dL (ref 6–20)
CALCIUM: 8.2 mg/dL — AB (ref 8.9–10.3)
CO2: 20 mmol/L — AB (ref 22–32)
CREATININE: 0.75 mg/dL (ref 0.44–1.00)
Chloride: 103 mmol/L (ref 101–111)
GFR calc non Af Amer: >60 mL/min (ref >60)
Glucose, Bld: 125 mg/dL — ABNORMAL HIGH (ref 65–99)
Potassium: 3.6 mmol/L (ref 3.5–5.1)
SODIUM: 133 mmol/L — AB (ref 135–145)

## 2016-02-14 LAB — MRSA PCR SCREENING: MRSA BY PCR: NEGATIVE

## 2016-02-14 MED ORDER — ONDANSETRON HCL 4 MG/2ML IJ SOLN
4.0000 mg | Freq: Three times a day (TID) | INTRAMUSCULAR | Status: DC | PRN
  Administered 2016-02-14 – 2016-02-17 (×3): 4 mg via INTRAVENOUS
  Filled 2016-02-14 (×3): qty 2

## 2016-02-14 NOTE — Care Management Obs Status (Signed)
MEDICARE OBSERVATION STATUS NOTIFICATION   Patient Details  Name: Mindy Baker MRN: [id number] Date of Birth: 05/04/1921   Medicare Observation Status Notification Given:  Yes    Lawerance Sabalebbie Caiden Arteaga, RN 02/14/2016, 12:11 PM

## 2016-02-14 NOTE — H&P (Signed)
Internal Medicine Attending Admission Note  I saw and evaluated the patient. I reviewed the resident's note and I agree with the resident's findings and plan as documented in the resident's note.  Assessment & Plan by Problem:   Sepsis due to UTI (urinary tract infection) - Reviewing her ED clinical course it does appear that she was documented to be tachycardic to 126 and tachypenic to 31, this would meet sirs criteria.  The ED note was concerned from some increased confusion which would also increase my concern for sepsis/ severe sepsis however her BMP did not show any evidence of an increased anion gap metabolic acidosis and her BP remained stable.  She was fortunately treated with IV antibiotics for her UTI and appears to be responding well.  I do not suspect that adding any blood cultures would add any benefit at this point but we will follow her urine culture. - I agree with continuing IV ceftriaxone.    Atrial fibrillation (HCC),  Aortic stenosis, severe,  CAD S/P percutaneous coronary angioplasty - Had some RVR in the ED otherwise this is improved. -Continue cardizem 180mg  for rate control -CHADS2vasc score is at least 6 with age, CVA history, HTN, and female, agree with Xarelto for A/C - Unclear that she would still need DAPT with Xarelto, OK to hold aspirin    Hypertension -Hold lisinopril    Fall - She has been extensively imaged, however given her A/C if she has neuro changes she will need repeat Stat CT head    Right torticollis - Her head is rotated to the right, she has acute tenderness of the right SCM, she may have a component of torticollis due to muscle spasm after her fall.  Will treat with heat, OK to add baclophen if needed.    Osteoporosis - She has evidence of vertebral compression fractures on her multiple imaging studies, also given her previous pelvic fracture she would also qualify as osteoporotic.  We need to obtain records to see if she was treated with  bisphosphonate in the past and failed treatment or never treated.  Dispo: Pending-Change status to Inpatient, patient meet criteria for Sepsis on presentation, we will continue IV antibiotics. Chief Complaint(s):  History - key components related to admission:  Briefly Mindy Baker is a 82 year old female with past medical history of atrial fibrillation on anticoagulation with Xarelto, hypertension, CAD, prior CVA, osteoporosis. She presented yesterday from an ALF due to decreased responsiveness and vomiting. Interestingly she presented the day before to the emergency department after an unwitnessed fall at the same ALF. In the ED visit on the 18th she had multiple imaging studies which ruled out any acute fracture or intracranial abnormality and she was discharged back to her ALF. Initially in the emergency department patient was unable to provide much history a thorough workup revealed a urinalysis that was suggestive of infection and therefore she was given ceftriaxone and admitted to the internal medicine service for further management. Today on exam the patient reports she is feeling better however she still feels very weak. She is unsure of the events that led to her admission. She does have some back pain, neck pain and generalized soreness, her daughter who is present in the room reminds her of her fall and she agrees that it is most likely the cause of her aches and pains.  Lab results: Reviewed in Epic  Physical Exam - key components related to admission: General: resting in bed HEENT:  EOMI, no scleral icterus, head  rotated to the right and slightly extended, SCM on right acutely tender to palpation Cardiac: irr irr, 3/6 systolic murmur Pulm: clear to auscultation bilaterally Abd: soft, nontender, nondistended, BS present Ext: warm and well perfused, no pedal edema Neuro: alert and oriented X2, cranial nerves II-XII grossly intact, moving all extremities.  Vitals:   02/13/16 1756 02/13/16  2058 02/14/16 0447 02/14/16 1138  BP: 120/73 136/85 131/88 138/83  Pulse: 97 (!) 52 (!) 107   Resp: 18 18    Temp: 97.6 F (36.4 C) 98.9 F (37.2 C) 98.8 F (37.1 C)   TempSrc: Oral Oral Oral   SpO2: 94% 91% 93%   Weight: 149 lb 6.4 oz (67.8 kg)     Height: 5\' 6"  (1.676 m)

## 2016-02-14 NOTE — Progress Notes (Signed)
Subjective: No acute events overnight. Patient continues to have mild headache. Her headache remains turned to the right and she has a very tight sternocleidomastoid muscle. She had no additional episodes of emesis. She has mild back pain following her fall. She had no additional complaints this morning.  Objective:  Vital signs in last 24 hours: Vitals:   02/13/16 1645 02/13/16 1756 02/13/16 2058 02/14/16 0447  BP: 111/70 120/73 136/85 131/88  Pulse: 101 97 (!) 52 (!) 107  Resp: 24 18 18    Temp:  97.6 F (36.4 C) 98.9 F (37.2 C) 98.8 F (37.1 C)  TempSrc:  Oral Oral Oral  SpO2: 91% 94% 91% 93%  Weight:  149 lb 6.4 oz (67.8 kg)    Height:  5\' 6"  (1.676 m)     Physical Exam  Constitutional: She is oriented to person, place, and time. She appears well-developed and well-nourished.  HENT:  Head: Normocephalic.  Patient's head turned to the right. SCM tight and tender to palpation  Eyes: Pupils are equal, round, and reactive to light.  Cardiovascular:  Murmur heard. Irregular rhythm, grade 3/6. Murmur appreciated best at the right upper sternal border  Respiratory: Effort normal and breath sounds normal. No respiratory distress. She has no wheezes.  GI: Soft. Bowel sounds are normal. She exhibits no distension. There is no tenderness.  Musculoskeletal: She exhibits no edema.  Neurological: She is alert and oriented to person, place, and time.     Assessment/Plan:  Active Problems:   UTI (urinary tract infection)   Atrial fibrillation (HCC)   Hypertension   Aortic stenosis, severe   CAD S/P percutaneous coronary angioplasty  Ms. Alto DenverHunt is a 82 year old frail appearing female with a medical history of atrial fibrillation on anticoagulation, hypertension, severe aortic stenosis, osteoporosis and coronary artery disease status post stent who presents one day after a fall with headache, emesis and lethargy.   # Headache and lethargy The patient presents one day after  unwitnessed fall with new headache, emesis and lethargy. CT scan performed in the emergency department showed no evidence of intracranial pathology. Laboratory evaluation was mostly unremarkable. The patient is on Xarelto for atrial fibrillation and discuss at high risk for a intracranial bleed following a mechanical fall. She continues to have a mild headache this morning and some nausea but no episodes of emesis. At this time we will continue to monitor her clinically with neurological examinations and watch for worsening function. If the patient deteriorates we will repeat imaging to assess for potential brain bleed. -- Physical therapy consult -- daily neurological examination  # Urinary tract infection Patient's urinalysis showed nitrites and leukocytes consistent with urinary tract infection. Results of the urine culture not returned. We will continue to treat with ceftriaxone. I suspect part of her lethargy and altered mental status is secondary to urinary tract infection in this elderly lady with multiple comorbidities. -- Continue ceftriaxone -- Follow-up urine culture   # Atrial fibrillation ## Aortic Stenosis  ## Coronary artery disease status post stent The patient has a history of atrial fibrillation which she is anticoagulated with Xarelto. Additionally, the patient has a history of coronary artery disease and is status post stent. She is currently on DAPT following her angioplasty and stent. However, given that she is greater than 6 months out from stent she will no longer need dual antiplatelet therapy. We will continue her on clopidogrel and cancel aspirin. She was in atrial fibrillation on examination today. Her MAPs were appropriate. -- Xarelto  15 mg daily -- Clopidogrel 75 mg once daily -- Rate controlled with diltiazem 180 mg extended release  # Osteoporosis The patient's daughter states that she had a fall approximately one year ago and suffered a pelvic fracture. Given  that the patient has suffered a fragility fracture she meets criteria to be diagnosed with osteoporosis. She may benefit from bisphosphonate therapy while in the inpatient setting -- Consider bisphosphonate  # Hypertension Currently, the patient is not hypertensive. Additionally, given her recent fall we will hold her lisinopril at this time. We will resume if she becomes hypertensive. -- Hold home lisinopril  DVT/PE prophylaxis: Therapeutically anticoagulated with Xarelto FEN/GI: Thin Code: DO NOT RESUSCITATE    Dispo: Anticipated discharge in approximately 2-3 day(s).   Thomasene LotJames Madilynn Montante, MD 02/14/2016, 11:31 AM Pager: [phone]

## 2016-02-14 NOTE — [place] FL2 (Signed)
[place] MEDICAID FL2 LEVEL OF CARE SCREENING TOOL     IDENTIFICATION  Patient Name: Mindy Baker Birthdate: 02/09/1922 Sex: female Admission Date (Current Location): 02/13/2016  [facility] [place]   Facility and Address:  [facility] [place], [address], [place], [place] [address]      Provider Number: [id number]  Attending Physician Name and Address:  Gust RungErik C Hoffman, DO  Relative Name and Phone Number:  Mindi JunkerMarsha, daughter, [phone]    Current Level of Care: Hospital Recommended Level of Care: Skilled Nursing Facility Prior Approval Number:    Date Approved/Denied:   PASRR Number:   [id number]  Discharge Plan: SNF    Current Diagnoses: Patient Active Problem List   Diagnosis Date Noted  . Fall 02/14/2016  . Right torticollis 02/14/2016  . Osteoporosis 02/14/2016  . UTI (urinary tract infection) 02/13/2016  . Atrial fibrillation (HCC) 02/13/2016  . Hypertension 02/13/2016  . Aortic stenosis, severe 02/13/2016  . CAD S/P percutaneous coronary angioplasty 02/13/2016    Orientation RESPIRATION BLADDER Height & Weight     Self  Normal Incontinent Weight: 67.8 kg (149 lb 6.4 oz) Height:  5\' 6"  (167.6 cm)  BEHAVIORAL SYMPTOMS/MOOD NEUROLOGICAL BOWEL NUTRITION STATUS      Continent Diet (Please see DC Summary)  AMBULATORY STATUS COMMUNICATION OF NEEDS Skin   Extensive Assist Verbally Other (Comment) (Laceration on head)                       Personal Care Assistance Level of Assistance  Bathing, Feeding, Dressing Bathing Assistance: Limited assistance Feeding assistance: Limited assistance Dressing Assistance: Limited assistance     Functional Limitations Info             SPECIAL CARE FACTORS FREQUENCY  PT (By licensed PT)     PT Frequency: 5x/week              Contractures      Additional Factors Info  Code Status, Allergies Code Status Info: DNR Allergies Info: Ampicillin, Codeine, Statins            Current Medications (02/14/2016):  This is the current hospital active medication list Current Facility-Administered Medications  Medication Dose Route Frequency Provider Last Rate Last Dose  . 0.9 %  sodium chloride infusion   Intravenous Continuous Thomasene LotJames Taylor, MD      . acetaminophen (TYLENOL) tablet 500 mg  500 mg Oral Q8H PRN Hyacinth Meekerasrif Ahmed, MD   500 mg at 02/14/16 1153  . calcium-vitamin D (OSCAL WITH D) 500-200 MG-UNIT per tablet 1 tablet  1 tablet Oral Daily Tasrif Ahmed, MD   1 tablet at 02/14/16 1130  . cefTRIAXone (ROCEPHIN) 1 g in dextrose 5 % 50 mL IVPB  1 g Intravenous Q24H Tasrif Ahmed, MD   1 g at 02/14/16 1405  . cholecalciferol (VITAMIN D) tablet 1,000 Units  1,000 Units Oral Daily Tasrif Ahmed, MD   1,000 Units at 02/14/16 1131  . clopidogrel (PLAVIX) tablet 75 mg  75 mg Oral Daily Tasrif Ahmed, MD   75 mg at 02/14/16 1130  . diltiazem (CARDIZEM CD) 24 hr capsule 180 mg  180 mg Oral Daily Tasrif Ahmed, MD   180 mg at 02/14/16 1131  . levothyroxine (SYNTHROID, LEVOTHROID) tablet 50 mcg  50 mcg Oral QAC breakfast Tasrif Ahmed, MD   50 mcg at 02/14/16 1130  . multivitamin with minerals tablet 1 tablet  1 tablet Oral Daily Hyacinth Meekerasrif Ahmed, MD  1 tablet at 02/14/16 1130  . nitroGLYCERIN (NITROSTAT) SL tablet 0.4 mg  0.4 mg Sublingual Q5 min PRN Tasrif Ahmed, MD      . ondansetron (ZOFRAN) injection 4 mg  4 mg Intravenous Q8H PRN Thomasene LotJames Taylor, MD   4 mg at 02/14/16 1151  . polyethylene glycol (MIRALAX / GLYCOLAX) packet 17 g  17 g Oral Daily Tasrif Ahmed, MD   17 g at 02/14/16 1131  . Rivaroxaban (XARELTO) tablet 15 mg  15 mg Oral Q supper Hyacinth Meekerasrif Ahmed, MD   15 mg at 02/13/16 1918  . senna-docusate (Senokot-S) tablet 1 tablet  1 tablet Oral Daily Tasrif Ahmed, MD   1 tablet at 02/14/16 1131  . vitamin B-12 (CYANOCOBALAMIN) tablet 500 mcg  500 mcg Oral Daily Tasrif Ahmed, MD   500 mcg at 02/14/16 1130     Discharge Medications: Please see discharge summary for a list of  discharge medications.  Relevant Imaging Results:  Relevant Lab Results:   Additional Information SSN: [id number]  Ilhan Debenedetto S El RefugioRayyan, [place]

## 2016-02-14 NOTE — Clinical Social Work Note (Signed)
Clinical Social Work Assessment  Patient Details  Name: Mindy Baker MRN: [id number] Date of Birth: 12/11/1921  Date of referral:  02/14/16               Reason for consult:  Facility Placement                Permission sought to share information with:  Facility Medical sales representativeContact Representative, Family Supports Permission granted to share information::  No (Patient is disoriented;)  Name::     Geologist, engineeringMarsha/Mindy Baker  Agency::  SNFs  Relationship::  Daughters  Contact Information:  [phone]  Housing/Transportation Living arrangements for the past 2 months:  Assisted Living Facility Source of Information:  Adult Children Patient Interpreter Needed:  None Criminal Activity/Legal Involvement Pertinent to Current Situation/Hospitalization:  No - Comment as needed Significant Relationships:  Adult Children Lives with:  Facility Resident Do you feel safe going back to the place where you live?  No Need for family participation in patient care:  Yes (Comment)  Care giving concerns:  CSW received consult for possible SNF placement at time of discharge. CSW spoke with patient's daughter, Mindy JunkerMarsha. She suggested I speak with her sister, Mindy SquibbJane [phone]) regarding PT recommendation of SNF placement at time of discharge. CSW left Mindy SquibbJane a Engineer, technical salesvoicemail. Mindy JunkerMarsha reported that [facility]. However, the family would like patient to go to snf to get stronger before returning. Patient's daughter expressed understanding of PT recommendation and is agreeable to SNF placement at time of discharge. CSW to continue to follow and assist with discharge planning needs.   Social Worker assessment / plan:  CSW spoke with patient's daughter concerning [facility] before returning home.  Employment status:  Retired Health and safety inspectornsurance information:  Medicare PT Recommendations:  Skilled Nursing Facility Information / Referral to community resources:  Skilled Nursing  Facility  Patient/Family's Response to care:  Patient's daughters recognize need for rehab before returning to ALF and [place]. CSW provided snf list. Patient's daughter reported understanding of medicare guidelines for snf stay.  Patient/Family's Understanding of and Emotional Response to Diagnosis, Current Treatment, and Prognosis:  Patient/family is realistic regarding therapy needs and expressed being hopeful for SNF placement. Patient's daughter expressed understanding of CSW role and discharge process. No questions/concerns about plan or treatment.    Emotional Assessment Appearance:  Appears stated age Attitude/Demeanor/Rapport:  Unable to Assess Affect (typically observed):  Unable to Assess Orientation:  Oriented to Self Alcohol / Substance use:  Not Applicable Psych involvement (Current and /or in the community):  No (Comment)  Discharge Needs  Concerns to be addressed:  Care Coordination Readmission within the last 30 days:  No Current discharge risk:  None Barriers to Discharge:  Continued Medical Work up   [employer] S Brion Hedges, LCSWA 02/14/2016, 2:31 PM

## 2016-02-14 NOTE — Evaluation (Signed)
Physical Therapy Evaluation Patient Details Name: Mindy Baker MRN: [id number] DOB: 09/28/1921 Today's Date: 02/14/2016   History of Present Illness  Pt is a 82 y/o female admitted secondary to emesis and recent fall at her assisted living facility (CT and x-rays were negative). PMH including but not limited to dementia, A-fib, HTN, CAD s/p stent and prior CVA.  Clinical Impression  Pt presented supine in bed, awake and willing to participate in therapy session. Prior to admission, pt was living in an assisted living facility where she ambulated with use of rollator and required assistance with ADLs. Pt currently requires min A for sit-to-stand and SPT transfers. Pt very limited this session secondary to pain. Pt would continue to benefit from skilled physical therapy services at this time while admitted and after d/c to address her below listed limitations in order to improve her overall safety and independence with functional mobility.    Follow Up Recommendations SNF    Equipment Recommendations  None recommended by PT;Other (comment) (defer to next venue)    Recommendations for Other Services       Precautions / Restrictions Precautions Precautions: Fall Restrictions Weight Bearing Restrictions: No      Mobility  Bed Mobility Overal bed mobility: Needs Assistance Bed Mobility: Supine to Sit     Supine to sit: Mod assist;HOB elevated     General bed mobility comments: pt required increased time, use of bed rails and mod A at trunk to achieve sitting EOB  Transfers Overall transfer level: Needs assistance Equipment used: Rolling walker (2 wheeled) Transfers: Sit to/from [employer] CorporationStand;Stand Pivot Transfers Sit to Stand: Min assist Stand pivot transfers: Min assist       General transfer comment: pt required increased time, good technique, min A for safety and pivotal movements to recliner  Ambulation/Gait                Stairs            Wheelchair Mobility     Modified Rankin (Stroke Patients Only)       Balance Overall balance assessment: Needs assistance Sitting-balance support: Feet supported;Bilateral upper extremity supported Sitting balance-Leahy Scale: Poor Sitting balance - Comments: pt with use of bilateral UEs on bed (bracing secondary to pain)   Standing balance support: During functional activity;Bilateral upper extremity supported Standing balance-Leahy Scale: Poor Standing balance comment: pt reliant on bilateral UEs on RW                             Pertinent Vitals/Pain Pain Assessment: Faces Faces Pain Scale: Hurts little more Pain Location: back and LEs Pain Descriptors / Indicators: Sore Pain Intervention(s): Monitored during session;Repositioned    Home Living Family/patient expects to be discharged to:: Assisted living               Home Equipment: Dan HumphreysWalker - 4 wheels [facility]    Prior Function Level of Independence: Needs assistance   Gait / Transfers Assistance Needed: pt ambulated with use of rollator  ADL's / Homemaking Assistance Needed: Pt needs assistance with bathing, dressing and standby assist for ambulating from her room to the dining hall.        Hand Dominance        Extremity/Trunk Assessment   Upper Extremity Assessment Upper Extremity Assessment: Overall WFL for tasks assessed    Lower Extremity Assessment Lower Extremity Assessment: Generalized weakness       Communication  Communication: No difficulties  Cognition Arousal/Alertness: Awake/alert Behavior During Therapy: Anxious Overall Cognitive Status: History of cognitive impairments - at baseline                      General Comments      Exercises     Assessment/Plan    PT Assessment Patient needs continued PT services  PT Problem List Decreased strength;Decreased activity tolerance;Decreased balance;Decreased mobility;Decreased coordination;Decreased  knowledge of use of DME;Decreased safety awareness;Pain          PT Treatment Interventions DME instruction;Gait training;Functional mobility training;Therapeutic activities;Therapeutic exercise;Balance training;Neuromuscular re-education;Patient/family education    PT Goals (Current goals can be found in the Care Plan section)  Acute Rehab PT Goals Patient Stated Goal: go to rehab PT Goal Formulation: With patient/family Time For Goal Achievement: 02/28/16 Potential to Achieve Goals: Fair    Frequency Min 3X/week   Barriers to discharge        Co-evaluation               End of Session Equipment Utilized During Treatment: Gait belt Activity Tolerance: Patient limited by pain Patient left: in chair;with call bell/phone within reach;with chair alarm set;with family/visitor present Nurse Communication: Mobility status         Time: [id number] PT Time Calculation (min) (ACUTE ONLY): 25 min   Charges:   PT Evaluation $PT Eval Moderate Complexity: 1 Procedure PT Treatments $Therapeutic Activity: 8-22 mins   PT G CodesAlessandra Bevels:        Tierre Netto M Hope Holst 02/14/2016, 12:28 PM Deborah ChalkJennifer Brenn Deziel, PT, DPT [phone]

## 2016-02-14 NOTE — Care Management Note (Addendum)
Case Management Note  Patient Details  Name: Mindy Baker MRN: [id number] Date of Birth: 27-Mar-1921  Subjective/Objective:                 Spoke with patient's daughter (from [place]) at the bedside. Patient oriented but nauseated and not feeling well. In obs at the time of conversation for UTI, IV Abx. [facility] [place]. Per daughter patient got confused/ weak when getting up to go to the bathroom last night. Had a similar episode at an ALF in [place] in Oct when she sustained a hip fracture. Patient gets assistance with ADLs, and help to meals. Has rolator, however daughter feels it gets away from her and she does not have good control of hand brakes. Patient on anticoag at time of fall.    Action/Plan:  Requested RW and BSC for DC to ALF, however would prefer DC to SNF. Reviewed Medicare requirement of 3 night IP stay, and MOON letter. Daughter stated that she was very familiar with Medicare requirement. Patient met for inpatient status and CM place CSW consult for assistance with SNF placement.   Expected Discharge Date:                  Expected Discharge Plan:  Assisted Living / Rest Home  In-House Referral:  Clinical Social Work  Discharge planning Services  CM Consult  Post Acute Care Choice:    Choice offered to:  Adult Children  DME Arranged:    DME Agency:     HH Arranged:    HH Agency:     Status of Service:  In process, will continue to follow  If discussed at Long Length of Stay Meetings, dates discussed:    Additional Comments:  Carles Collet, RN 02/14/2016, 12:15 PM

## 2016-02-15 DIAGNOSIS — B957 Other staphylococcus as the cause of diseases classified elsewhere: Secondary | ICD-10-CM

## 2016-02-15 LAB — URINE CULTURE: Culture: >=100000 — AB

## 2016-02-15 LAB — BASIC METABOLIC PANEL
Anion gap: 8 (ref 5–15)
BUN: 22 mg/dL — AB (ref 6–20)
CALCIUM: 8.4 mg/dL — AB (ref 8.9–10.3)
CO2: 20 mmol/L — AB (ref 22–32)
CREATININE: 0.81 mg/dL (ref 0.44–1.00)
Chloride: 104 mmol/L (ref 101–111)
GFR calc non Af Amer: >60 mL/min (ref >60)
GLUCOSE: 154 mg/dL — AB (ref 65–99)
Potassium: 3.5 mmol/L (ref 3.5–5.1)
Sodium: 132 mmol/L — ABNORMAL LOW (ref 135–145)

## 2016-02-15 MED ORDER — DOXYCYCLINE HYCLATE 100 MG PO TABS
100.0000 mg | ORAL_TABLET | Freq: Two times a day (BID) | ORAL | Status: DC
  Administered 2016-02-15 – 2016-02-17 (×5): 100 mg via ORAL
  Filled 2016-02-15 (×5): qty 1

## 2016-02-15 MED ORDER — CALCIUM CARBONATE-VITAMIN D 500-200 MG-UNIT PO TABS
1.0000 | ORAL_TABLET | ORAL | Status: DC
  Administered 2016-02-16: 1 via ORAL
  Filled 2016-02-15: qty 1

## 2016-02-15 MED ORDER — ADULT MULTIVITAMIN W/MINERALS CH
1.0000 | ORAL_TABLET | ORAL | Status: DC
  Administered 2016-02-16: 1 via ORAL
  Filled 2016-02-15: qty 1

## 2016-02-15 NOTE — Progress Notes (Signed)
Switched patient to doxy following sensitivities from urine culture.

## 2016-02-15 NOTE — Progress Notes (Signed)
Subjective: No acute events overnight. Patient doing better. Torticollis imrpoved. Worked with PT yesterday. No headaches or n/v.   Objective:  Vital signs in last 24 hours: Vitals:   02/14/16 1138 02/14/16 1315 02/14/16 2116 02/15/16 0527  BP: 138/83 123/78 97/60 133/82  Pulse:  (!) 108 92 (!) 101  Resp:  16 19 18   Temp:  98.2 F (36.8 C) 98.6 F (37 C) 98.1 F (36.7 C)  TempSrc:  Oral Oral Oral  SpO2:  94% 95% 98%  Weight:      Height:       Physical Exam  Constitutional: She is oriented to person, place, and time. She appears well-developed and well-nourished.  HENT:  Head: Normocephalic.  SCM less tense, head resting normally this morning   Eyes: Pupils are equal, round, and reactive to light.  Cardiovascular:  Murmur heard. Irregular rhythm, grade 3/6. Murmur appreciated best at the right upper sternal border  Respiratory: Effort normal and breath sounds normal. No respiratory distress. She has no wheezes.  GI: Soft. Bowel sounds are normal. She exhibits no distension. There is no tenderness.  Musculoskeletal: She exhibits no edema.  Neurological: She is alert and oriented to person, place, and time.     Assessment/Plan:  Active Problems:   UTI (urinary tract infection)   Atrial fibrillation (HCC)   Hypertension   Aortic stenosis, severe   CAD S/P percutaneous coronary angioplasty   Fall   Right torticollis   Osteoporosis  Mindy Baker is a [age] year old frail appearing female with a medical history of atrial fibrillation on anticoagulation, hypertension, severe aortic stenosis, osteoporosis and coronary artery disease status post stent who presents one day after a fall with headache, emesis and lethargy.   In summary, patient doing well this morning. No headaches or n/v. Worked with PT who recommend SNF. We will continue to monitor for signs of brain bleed s/p fall on anticoagulation and treat for sepsis 2/2 UTI. I think clinical monitoring is important given  her high risk fall.    #  fall on anticoagulation  Patient without headache this morning. Denies n/v. Will continue to monitor given high risk patient s/p mechanical fall with head trauma on anticoagulation.  -- Physical therapy consult- recommend SNF -- daily neurological examination -- if neuro changes will need stat head CT w/o contrast to evaluate for hemorrhage   # Sepsis 2/2 UTI Vitals improved from admission. More responsive on examination. Continue with antibiotics. Urine culture growing > 100,000 colonies of coagulase negative Staph. Most likely staph saprophyticus vs. contaminant. Will f/u sensitivities.  -- Continue antibiotics -- F/u sensitivities    # Atrial fibrillation ## Aortic Stenosis  ## Coronary artery disease status post stent The patient has a history of atrial fibrillation which she is anticoagulated with Xarelto. Additionally, the patient has a history of coronary artery disease and is status post stent. She is currently on DAPT following her angioplasty and stent. However, given that she is greater than 6 months out from stent she will no longer need dual antiplatelet therapy.  -- Xarelto 15 mg daily -- Clopidogrel 75 mg once daily -- Rate controlled with diltiazem 180 mg extended release  # Osteoporosis The patient's daughter states that she had a fall approximately one year ago and suffered a pelvic fracture. Given that the patient has suffered a fragility fracture she meets criteria to be diagnosed with osteoporosis. She may benefit from bisphosphonate therapy while in the inpatient setting -- f/u records to determine need for  bisphosphonate   # Hypertension Currently normotensive. Additionally, given her recent fall we will hold her lisinopril at this time. We will resume if she becomes hypertensive. -- Hold home lisinopril  DVT/PE prophylaxis: Therapeutically anticoagulated with Xarelto FEN/GI: Thin Code: DO NOT RESUSCITATE    Dispo: Anticipated  discharge in approximately 1-2 day(s).   Mindy LotJames Sahej Hauswirth, MD 02/15/2016, 7:33 AM Pager: [phone]

## 2016-02-16 LAB — BASIC METABOLIC PANEL
ANION GAP: 7 (ref 5–15)
BUN: 21 mg/dL — ABNORMAL HIGH (ref 6–20)
CHLORIDE: 103 mmol/L (ref 101–111)
CO2: 23 mmol/L (ref 22–32)
Calcium: 8.6 mg/dL — ABNORMAL LOW (ref 8.9–10.3)
Creatinine, Ser: 0.77 mg/dL (ref 0.44–1.00)
GFR calc non Af Amer: >60 mL/min (ref >60)
GLUCOSE: 135 mg/dL — AB (ref 65–99)
POTASSIUM: 3.9 mmol/L (ref 3.5–5.1)
Sodium: 133 mmol/L — ABNORMAL LOW (ref 135–145)

## 2016-02-16 MED ORDER — DOXYCYCLINE HYCLATE 100 MG PO TABS: 100.0000 mg | ORAL_TABLET | Freq: Two times a day (BID) | ORAL | 0 refills | Status: AC

## 2016-02-16 NOTE — Discharge Summary (Signed)
Name: Mindy Baker MRN: [id number] DOB: 08/19/1921 82 y.o. PCP: Florentina JennyHenry Tripp, MD  Date of Admission: 02/13/2016  7:29 AM Date of Discharge: 02/16/2016 Attending Physician: Gust RungErik C Hoffman, DO  Discharge Diagnosis: 1. Sepsis secondary to urinary tract infection 2. Mechanical fall while on anticoagulation Active Problems:   UTI (urinary tract infection)   Atrial fibrillation (HCC)   Hypertension   Aortic stenosis, severe   CAD S/P percutaneous coronary angioplasty   Fall   Right torticollis   Osteoporosis   Discharge Medications: Allergies as of 02/16/2016      Reactions   Ampicillin Other (See Comments)   Per MAR   Codeine Other (See Comments)   Per MAR   Statins Other (See Comments)   Per MAR      Medication List    STOP taking these medications   aspirin EC 81 MG tablet   cholecalciferol 1000 units tablet Commonly known as:  VITAMIN D     TAKE these medications   acetaminophen 500 MG tablet Commonly known as:  TYLENOL Take 500 mg by mouth every 8 (eight) hours as needed for moderate pain.   Calcium-Vitamin D 600-200 MG-UNIT tablet Take 1 tablet by mouth daily.   clopidogrel 75 MG tablet Commonly known as:  PLAVIX Take 75 mg by mouth daily.   diltiazem 180 MG 24 hr capsule Commonly known as:  DILACOR XR Take 180 mg by mouth daily.   doxycycline 100 MG tablet Commonly known as:  VIBRA-TABS Take 1 tablet (100 mg total) by mouth every 12 (twelve) hours.   ENSURE Take 237 mLs by mouth 3 (three) times daily between meals.   levothyroxine 50 MCG tablet Commonly known as:  SYNTHROID, LEVOTHROID Take 50 mcg by mouth daily before breakfast.   lisinopril 5 MG tablet Commonly known as:  PRINIVIL,ZESTRIL Take 5 mg by mouth daily.   multivitamin with minerals Tabs tablet Take 1 tablet by mouth daily.   nitroGLYCERIN 0.4 MG SL tablet Commonly known as:  NITROSTAT Place 0.4 mg under the tongue every 5 (five) minutes as needed for chest pain.     polyethylene glycol packet Commonly known as:  MIRALAX / GLYCOLAX Take 17 g by mouth daily.   Rivaroxaban 15 MG Tabs tablet Commonly known as:  XARELTO Take 15 mg by mouth daily with supper.   senna-docusate 8.6-50 MG tablet Commonly known as:  Senokot-S Take 1 tablet by mouth daily.   vitamin B-12 500 MCG tablet Commonly known as:  CYANOCOBALAMIN Take 500 mcg by mouth daily.       Disposition and follow-up:   Mindy Baker [facility].  At the hospital follow up visit please address:  1.  Please ensure the patient finishes her antibiotics.  2.  Labs / imaging needed at time of follow-up: none  3.  Pending labs/ test needing follow-up: none  Follow-up Appointments:   Hospital Course by problem list: Active Problems:   UTI (urinary tract infection)   Atrial fibrillation (HCC)   Hypertension   Aortic stenosis, severe   CAD S/P percutaneous coronary angioplasty   Fall   Right torticollis   Osteoporosis   1. Sepsis secondary to urinary tract infection The patient presented to the [facility] emergency department on 02/13/2016 from a nursing facility with a one-day history of headache, nausea and vomiting. Additionally, associated with these symptoms the patient was noted to have decreased responsiveness and was more lethargic than usual. Upon arrival in  the emergency Department laboratory evaluation demonstrated a CBC within normal limits. A urinalysis was positive for nitrite and demonstrated small leukocytes consistent with urinary tract infection. Additionally, secondary to a recent fall (see below under point over 2) the patient had a repeat head CT without contrast which did not show any acute intracranial abnormality. She was admitted to the [facility] internal medicine teaching service for further workup and management. Once on service the patient was continued to be treated with IV ceftriaxone and given IV  hydration. She improved from a symptom standpoint. However on 02/15/2016 culture results from her urine showed coagulase negative Staphylococcus species. Sensitivities from this culture showed resistance to oxacillin among other antibiotics. The culture did show sensitivity to tetracycline and the patient was switched from IV ceftriaxone to oral doxycycline 100 mg twice a day. Following transition to this oral antibiotic the patient's mental status continued to improve. However on 02/16/2016 the patient still seemed to weak for discharge and given her recent antibiotic switch we wanted to medically watch her for an additional day. She will be discharged with a 3 day course of oral doxycycline to complete 5 total days of treatment for a urinary tract infection.  2. Mechanical fall on anticoagulation Importantly, the patient initially presented to the [facility] emergency department on 02/12/2016 following a mechanical fall where she hit her head. At her initial presentation she was noted to have a large superficial head laceration which was closed appropriately. She had an extensive imaging evaluation at that time including a CT scan of the head without contrast, CT cervical spine and CT thoracic spine. In addition to these imaging modalities the patient had pelvic radiographs and a chest x-ray. This extensive imaging workup was negative for any acute process or fracture. At this initial visit she was treated with pain control and sent back to her facility. When she presented most recently on 02/13/2016 for headache with nausea and vomiting there was concern that she may have a possible brain bleed that developed later after this fall. A stat head CT without contrast was repeated which was negative for any bleed or other intracranial abnormality. She was given Tylenol for the treatment of her headache. Because the patient was high risk for developing a bleed givin her age and anticoagulation it was deemed  necessary that she be watched in the inpatient setting with frequent neurological examinations. Over the course of her inpatient stay her neuro status remained normal and her headache and nausea subsided. At discharge she'll resume normal medications for her chronic medical conditions including anticoagulation which was never held during the course of these events. Discharge Vitals:   BP (!) 147/78 (BP Location: Left Arm)   Pulse 94   Temp 97.9 F (36.6 C) (Oral)   Resp 18   Ht 5\' 6"  (1.676 m)   Wt 149 lb 6.4 oz (67.8 kg)   SpO2 92%   BMI 24.11 kg/m   Pertinent Labs, Studies, and Procedures:  1. CT head without contrast-no acute intracranial abnormality 2. Chest x-ray-mild chronic interstitial changes. No fracture identified   Discharge Instructions: Discharge Instructions    Diet - low sodium heart healthy    Complete by:  As directed    Discharge instructions    Complete by:  As directed    We have treated her for a urinary tract infection. This infection was caused by coagulase-negative Staphylococcus. We are treating this with doxycycline. When she leaves the hospital you'll need 3 additional days of  antibiotics. Please ensure you complete antibiotics for this infection.  Please resume your other medications as you were taking them before coming to the hospital. The only medicine that we have changed is your aspirin. You no longer need to take aspirin daily.   Increase activity slowly    Complete by:  As directed       Signed: Thomasene LotJames Lyndon Chenoweth, MD 02/16/2016, 12:41 PM   Pager: [phone]

## 2016-02-16 NOTE — Progress Notes (Signed)
Physical Therapy Treatment Patient Details Name: Mindy PilotBetty Baker MRN: [id number] DOB: 06/21/1921 Today's Date: 02/16/2016    History of Present Illness Pt is a 82 y/o female admitted secondary to emesis and recent fall at her assisted living facility (CT and x-rays were negative). PMH including but not limited to dementia, A-fib, HTN, CAD s/p stent and prior CVA.    PT Comments    Pt performed decreased activity and appears more confused and fearful of falling.  Pt progressed better with use of stedy frame.  Pt will continue to benefit from skilled rehab to improve strength and improve function.    Follow Up Recommendations  SNF     Equipment Recommendations  None recommended by PT;Other (comment)    Recommendations for Other Services       Precautions / Restrictions Precautions Precautions: Fall Restrictions Weight Bearing Restrictions: No    Mobility  Bed Mobility Overal bed mobility: Needs Assistance Bed Mobility: Supine to Sit;Rolling;Sit to Supine Rolling: Mod assist   Supine to sit: Max assist Sit to supine: Mod assist;+2 for physical assistance   General bed mobility comments: pt required increased time, use of bed rails and mod-max A at trunk to achieve sitting EOB  Transfers Overall transfer level: Needs assistance Equipment used:  (stedy frame) Transfers: Sit to/from Stand Sit to Stand: Min assist (Pt required decreased assistance with use of stedy.  Pt fearful of falling and reached and pulled on therapist.  The stedy allowed her to shift weight forward when holding to frame.  )         General transfer comment: Cues for forward weight shifting, cues for upper trunk control.  Pt able to stand for 10 sec x2 and refused to stand further.    Ambulation/Gait                 Stairs            Wheelchair Mobility    Modified Rankin (Stroke Patients Only)       Balance Overall balance assessment: Needs assistance   Sitting balance-Leahy  Scale: Poor       Standing balance-Leahy Scale: Fair                      Cognition Arousal/Alertness: Awake/alert Behavior During Therapy: Anxious Overall Cognitive Status: History of cognitive impairments - at baseline                      Exercises General Exercises - Lower Extremity Ankle Circles/Pumps: AROM;Both;10 reps;Supine Quad Sets: AROM;Both;10 reps;Supine Heel Slides: Supine;10 reps;Both;AAROM Hip ABduction/ADduction: Both;10 reps;Supine;AAROM Straight Leg Raises: Both;10 reps;Supine;AAROM    General Comments        Pertinent Vitals/Pain Pain Assessment: No/denies pain Faces Pain Scale: Hurts little more Pain Location: back and LEs Pain Descriptors / Indicators: Sore Pain Intervention(s): Monitored during session;Repositioned    Home Living                      Prior Function            PT Goals (current goals can now be found in the care plan section) Acute Rehab PT Goals Patient Stated Goal: go to rehab Potential to Achieve Goals: Fair Progress towards PT goals: Progressing toward goals    Frequency    Min 3X/week      PT Plan Current plan remains appropriate    Co-evaluation  End of Session Equipment Utilized During Treatment: Gait belt Activity Tolerance: Patient limited by pain Patient left: in chair;with call bell/phone within reach;with chair alarm set;with family/visitor present     Time: [id number] PT Time Calculation (min) (ACUTE ONLY): 23 min  Charges:  $Therapeutic Exercise: 8-22 mins $Therapeutic Activity: 8-22 mins                    G Codes:      Florestine Aversimee J Zoee Heeney 02/16/2016, 4:42 PM Joycelyn RuaAimee Lucita Montoya, PTA pager [phone]

## 2016-02-16 NOTE — Progress Notes (Signed)
Subjective: No acute events overnight. Patient doing better. Torticollis imrpoved. She was resting in a chair today. She is more interactive with the medical team this morning. She is not in any pain.   Objective:  Vital signs in last 24 hours: Vitals:   02/15/16 1005 02/15/16 1419 02/15/16 2035 02/16/16 0628  BP: 121/76 112/67 (!) 114/59 (!) 147/78  Pulse:  73 88 94  Resp:  20 18 18   Temp:  97.4 F (36.3 C) 98.4 F (36.9 C) 97.9 F (36.6 C)  TempSrc:  Oral Oral Oral  SpO2:  94% 96% 92%  Weight:      Height:       Physical Exam  Constitutional: She is oriented to person, place, and time. She appears well-developed and well-nourished.  Resting comfortably in a chair this morning  HENT:  Head: Normocephalic.  SCM less tense, head resting normally this morning   Eyes: Pupils are equal, round, and reactive to light.  Cardiovascular:  Murmur heard. Irregular rhythm, grade 3/6. Murmur appreciated best at the right upper sternal border  Respiratory: Effort normal and breath sounds normal. No respiratory distress. She has no wheezes.  GI: Soft. Bowel sounds are normal. She exhibits no distension. There is no tenderness.  Musculoskeletal: She exhibits no edema.  Neurological: She is alert and oriented to person, place, and time.     Assessment/Plan:  Active Problems:   UTI (urinary tract infection)   Atrial fibrillation (HCC)   Hypertension   Aortic stenosis, severe   CAD S/P percutaneous coronary angioplasty   Fall   Right torticollis   Osteoporosis  Mindy Baker is a 82 year old frail appearing female with a medical history of atrial fibrillation on anticoagulation, hypertension, severe aortic stenosis, osteoporosis and coronary artery disease status post stent who presents one day after a fall with headache, emesis and lethargy.  In summary, patient continues to improve clinically. However, yesterday the results of her urine culture which was growing coagulase-negative  staphylococcus showed resistance to oxacillin. Resistance to oxacillin will also confer resistance to ceftriaxone and thus she was not treated appropriately with antimicrobial therapy. Yesterday afternoon upon reviewing culture data the bacteria was found to be sensitive to tetracyclines and the patient was started on doxycycline twice daily. She does seem improved this morning but I think an additional day in the hospital would be beneficial to ensure that the antibiotic is effectively treating her infection and that she continues to improve.From a mental status standpoint following her fall on anticoagulation I think she is stable and the risk of a brain bleed is low at this time. We'll continue to monitor over the evening and plan for discharge to a skilled nursing facility tomorrow morning.  #  fall on anticoagulation  Denies headache. Denies n/v. Will continue to monitor given high risk patient s/p mechanical fall with head trauma on anticoagulation.  -- Physical therapy consult- recommend SNF -- daily neurological examination -- if neuro changes will need stat head CT w/o contrast to evaluate for hemorrhage   # Sepsis 2/2 UTI Vitals improved from admission. More responsive on examination. Continue with antibiotics. Urine culture growing > 100,000 colonies of coagulase negative Staph. Most likely staph saprophyticus vs. contaminant. Will f/u sensitivities. Sensitivities showed resistance to oxacillin which would also confer resistance to ceftriaxone. The bacteria was found to be sensitive to vancomycin, tetracycline and nitrofurantoin. We chose an oral antibiotic thus we did not go with vancomycin. Additionally, nitrofurantoin is relatively contraindicated in a patient of this  age. Based on these considerations we chose to treat the patient with doxycycline. -- Doxycycline 100 mg twice a day 5 days   # Atrial fibrillation ## Aortic Stenosis  ## Coronary artery disease status post stent The  patient has a history of atrial fibrillation which she is anticoagulated with Xarelto. Additionally, the patient has a history of coronary artery disease and is status post stent. She is currently on DAPT following her angioplasty and stent. However, given that she is greater than 6 months out from stent she will no longer need dual antiplatelet therapy.  -- Xarelto 15 mg daily -- Clopidogrel 75 mg once daily -- Rate controlled with diltiazem 180 mg extended release  # Osteoporosis The patient's daughter states that she had a fall approximately one year ago and suffered a pelvic fracture. Given that the patient has suffered a fragility fracture she meets criteria to be diagnosed with osteoporosis. She may benefit from bisphosphonate therapy while in the inpatient setting -- f/u records to determine need for bisphosphonate   # Hypertension Currently normotensive. Additionally, given her recent fall we will hold her lisinopril at this time. We will resume if she becomes hypertensive. -- Hold home lisinopril  DVT/PE prophylaxis: Therapeutically anticoagulated with Xarelto FEN/GI: Thin Code: DO NOT RESUSCITATE    Dispo: Anticipated discharge tomorrow.   Thomasene LotJames Mckinze Poirier, MD 02/16/2016, 10:53 AM Pager: [phone]

## 2016-02-16 NOTE — Progress Notes (Signed)
Planning for DC to [facility] tomorrow- DC sent today in preparation for weekend DC.  RN updated patient son at bedside of plan for DC to SNF tomorrow  CSW will continue to follow  Burna SisJenna H. Marasia Newhall, LCSW Clinical Social Worker [phone]

## 2016-02-17 NOTE — Clinical Social Work Note (Signed)
CSW facilitated patient discharge including contacting patient family and facility to confirm patient discharge plans. Clinical information faxed to facility and family agreeable with plan. CSW arranged ambulance transport via [facility] to [facility] at 11:00 am. RN to call report prior to discharge [phone] Room [phone])).  CSW will sign off for now as social work intervention is no longer needed. Please consult us again if new needs arise.  Charlynn CourtSarah Ashlyne Olenick, CSW [phone]

## 2016-02-17 NOTE — Clinical Social Work Placement (Signed)
   CLINICAL SOCIAL WORK PLACEMENT  NOTE  Date:  02/17/2016  Patient Details  Name: Mindy Baker MRN: [id number] Date of Birth: 11/13/1921  Clinical Social Work is seeking post-discharge placement for this patient at the Skilled  Nursing Facility level of care (*CSW will initial, date and re-position this form in  chart as items are completed):      Patient/family provided with [facility] Clinical Social Work Department's list of facilities offering this level of care within the geographic area requested by the patient (or if unable, by the patient's family).      Patient/family informed of their freedom to choose among providers that offer the needed level of care, that participate in Medicare, Medicaid or managed care program needed by the patient, have an available bed and are willing to accept the patient.      Patient/family informed of [facility]'s ownership interest in [facility] and [facility], as well as of the fact that they are under no obligation to receive care at these facilities.  PASRR submitted to EDS on       PASRR number received on       Existing PASRR number confirmed on 02/14/16     FL2 transmitted to all facilities in geographic area requested by pt/family on 02/14/16     FL2 transmitted to all facilities within larger geographic area on       Patient informed that his/her managed care company has contracts with or will negotiate with certain facilities, including the following:        Yes   Patient/family informed of bed offers received.  Patient chooses bed at [facility]     Physician recommends and patient chooses bed at      Patient to be transferred to [facility] on 02/17/16.  Patient to be transferred to facility by [facility]     Patient family notified on 02/17/16 of transfer.  Name of family member notified:  Carollee LeitzMarsha Efland     PHYSICIAN Please prepare prescriptions     Additional Comment:     _______________________________________________ Margarito LinerSarah C Devonia Farro, LCSW 02/17/2016, 10:04 AM

## 2016-02-17 NOTE — Progress Notes (Signed)
   Subjective: No acute events over night. Patient resting well this morning. Will be d/c to SNF.  Objective:  Vital signs in last 24 hours: Vitals:   02/16/16 1341 02/16/16 2154 02/17/16 0513 02/17/16 0844  BP: (!) 150/94 106/70 128/87 (!) 135/91  Pulse: 84 (!) 112 (!) 104 97  Resp: 18 20 19    Temp: 98.1 F (36.7 C) 97.6 F (36.4 C) 97.2 F (36.2 C)   TempSrc: Oral Oral Oral   SpO2: 95% 93% 96%   Weight:      Height:       Physical Exam  Constitutional: She is oriented to person, place, and time. She appears well-developed and well-nourished.  Resting comfortably in bed.  HENT:  Head: Normocephalic.  Eyes: Pupils are equal, round, and reactive to light.  Cardiovascular:  Murmur heard. Irregular rhythm, grade 3/6. Murmur appreciated best at the right upper sternal border  Respiratory: Effort normal and breath sounds normal. No respiratory distress. She has no wheezes.  GI: Soft. Bowel sounds are normal. She exhibits no distension. There is no tenderness.  Musculoskeletal: She exhibits no edema.  Neurological: She is alert and oriented to person, place, and time.     Assessment/Plan:  Active Problems:   UTI (urinary tract infection)   Atrial fibrillation (HCC)   Hypertension   Aortic stenosis, severe   CAD S/P percutaneous coronary angioplasty   Fall   Right torticollis   Osteoporosis  Ms. Alto DenverHunt is a 82 year old frail appearing female with a medical history of atrial fibrillation on anticoagulation, hypertension, severe aortic stenosis, osteoporosis and coronary artery disease status post stent who presents one day after a fall with headache, emesis and lethargy.  In summary, patient continues to improve clinically. Doing well on doxy. Improved this morning with no complaints. SNF arranged. Will be discharged with course to finish doxy for UTI.  #  fall on anticoagulation  Denies headache. Denies n/v. Improved and doing well. Will discharge today.  -- Physical  therapy consult- recommend SNF   # Sepsis 2/2 UTI Vitals improved from admission. Doing well on oral doxy. Will be discharged to SNF today with course to finish antibiotics.  -- Doxycycline 100 mg twice a day 5 days   # Atrial fibrillation ## Aortic Stenosis  ## Coronary artery disease status post stent The patient has a history of atrial fibrillation which she is anticoagulated with Xarelto. Additionally, the patient has a history of coronary artery disease and is status post stent. She is currently on DAPT following her angioplasty and stent. However, given that she is greater than 6 months out from stent she will no longer need dual antiplatelet therapy.  -- Xarelto 15 mg daily -- Clopidogrel 75 mg once daily -- Rate controlled with diltiazem 180 mg extended release    # Hypertension Currently normotensive. Additionally, given her recent fall we will hold her lisinopril at this time. We will resume if she becomes hypertensive. -- Hold home lisinopril  DVT/PE prophylaxis: Therapeutically anticoagulated with Xarelto FEN/GI: Thin Code: DO NOT RESUSCITATE    Dispo: Anticipated discharge tomorrow.   Thomasene LotJames Giordano Getman, MD 02/17/2016, 9:47 AM Pager: [phone]

## 2017-09-25 DEATH — deceased

## 2018-06-02 IMAGING — CT CT HEAD W/O CM
4 series · 16 of 47 positions shown, 18 images · non-contrast
Comparison: Yesterday

CLINICAL DATA: Fall yesterday.  Vomiting and confusion.

EXAM:
CT HEAD WITHOUT CONTRAST
TECHNIQUE: Contiguous axial images were obtained from the base of the skull
through the vertex without intravenous contrast.

[Series 2: head bone · axial · 0.44mm/px · z∈[-184,-152]mm · 3 of 82 slices shown]
[im 9/82  bone]
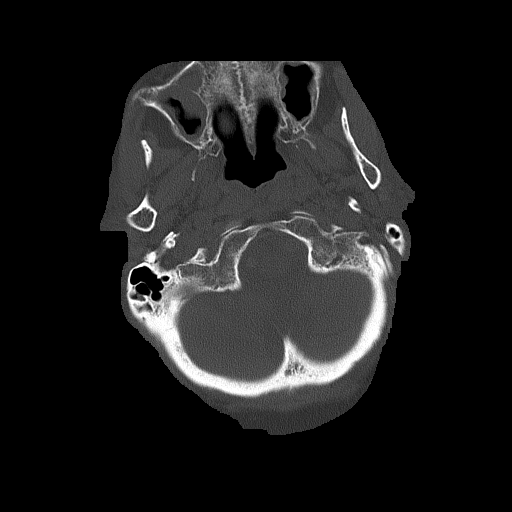
[im 17/82  bone]
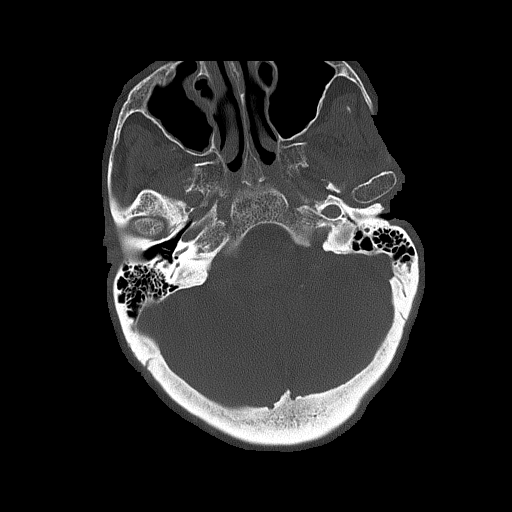
[im 25/82  bone]
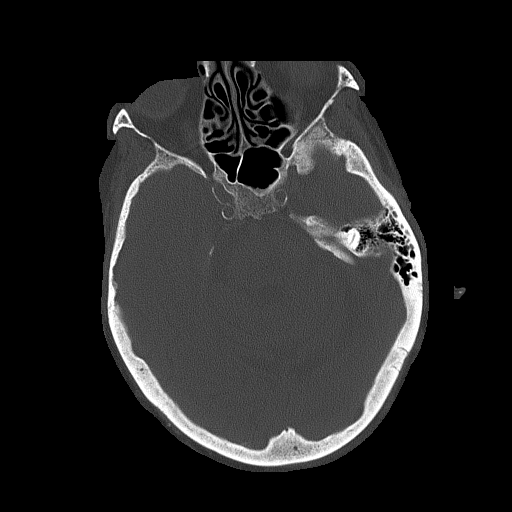

[Series 3: head without · axial · non-contrast · 0.44mm/px · z∈[-180,-60]mm · 7 of 33 slices shown, 9 images]
[im 5/33  brain]
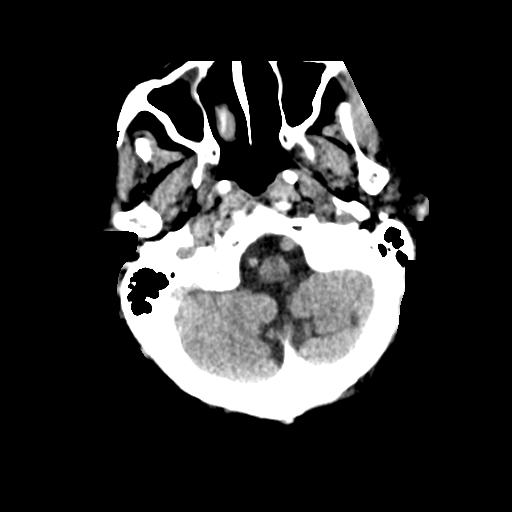
[im 5/33  bone]
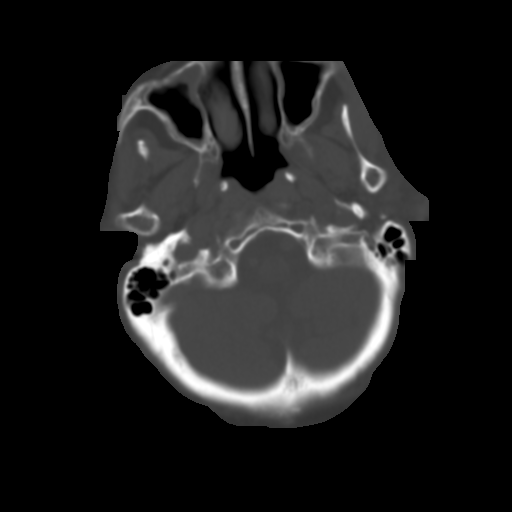
[im 9/33  brain]
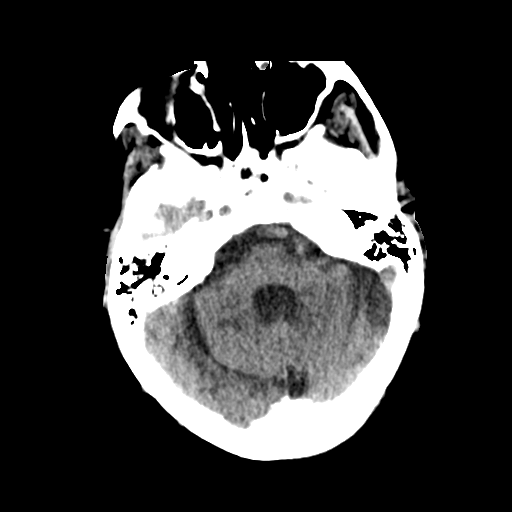
[im 13/33  brain]
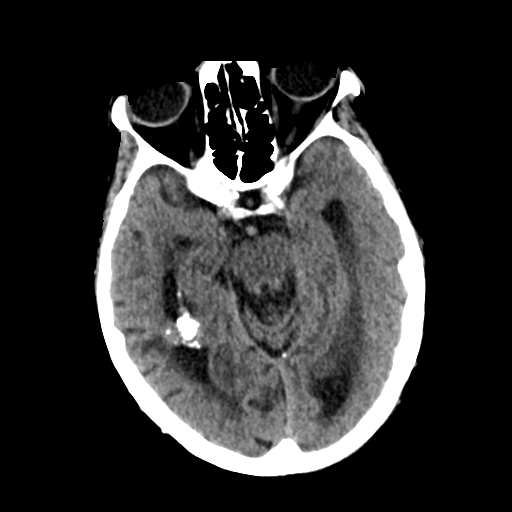
[im 17/33  brain]
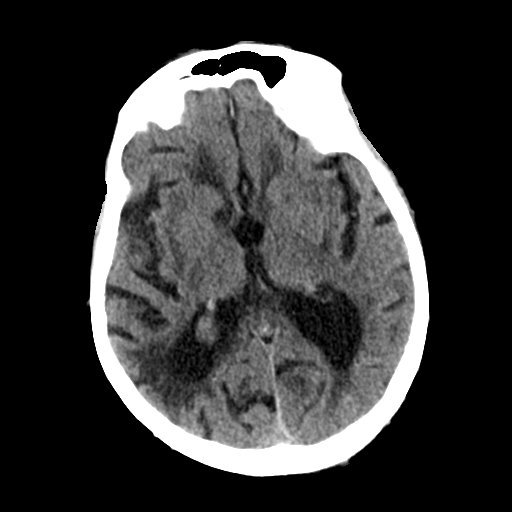
[im 21/33  brain]
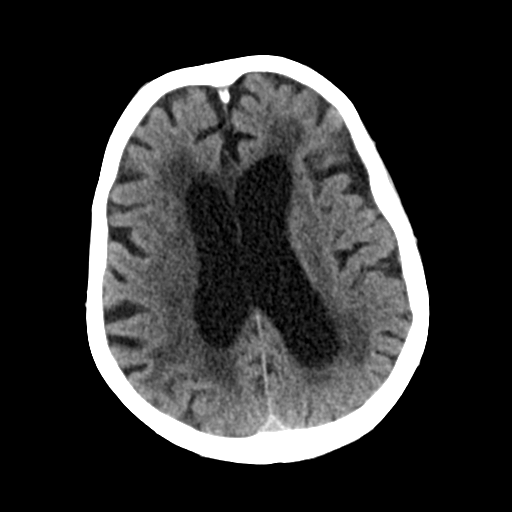
[im 21/33  bone]
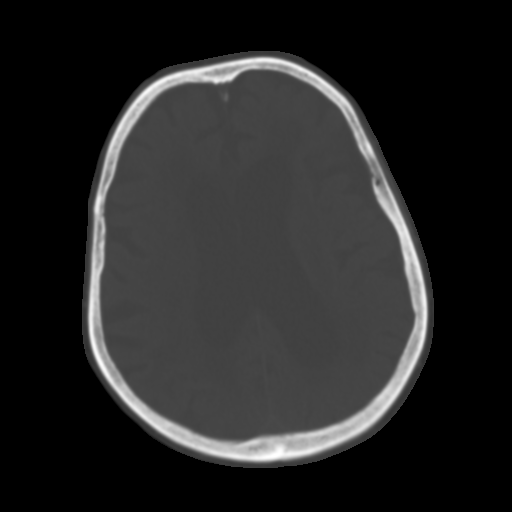
[im 25/33  brain]
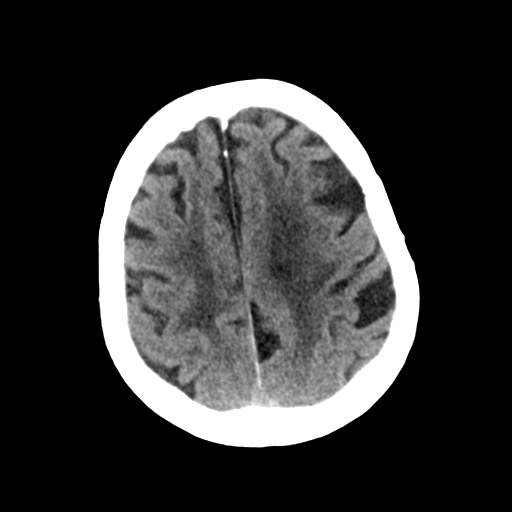
[im 29/33  brain]
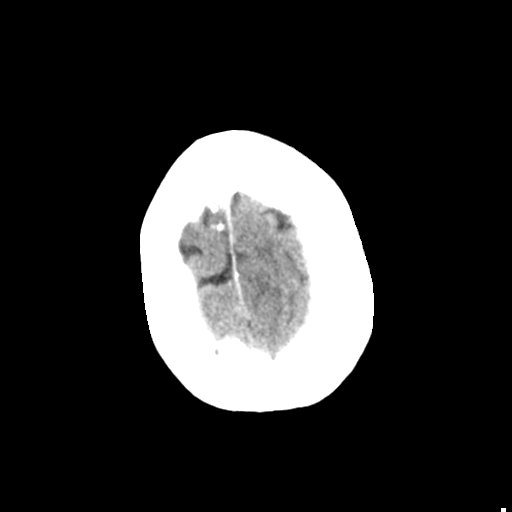

[Series 4: head without cor · coronal · non-contrast · 0.32mm/px · 3 of 64 slices shown]
[im 22/64  brain]
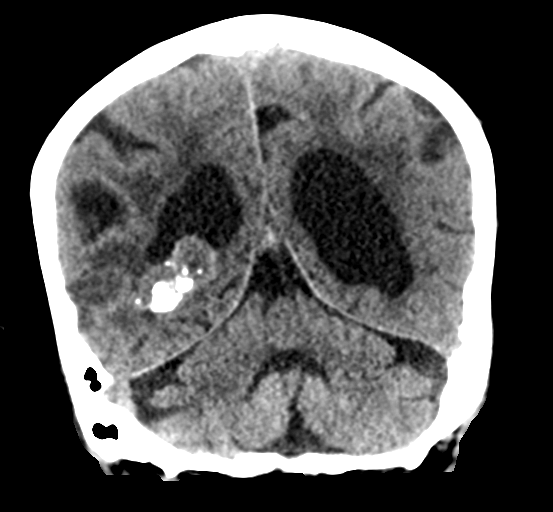
[im 29/64  brain]
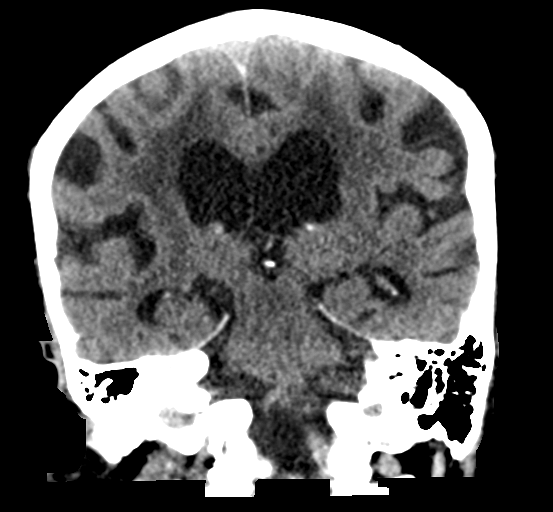
[im 36/64  brain]
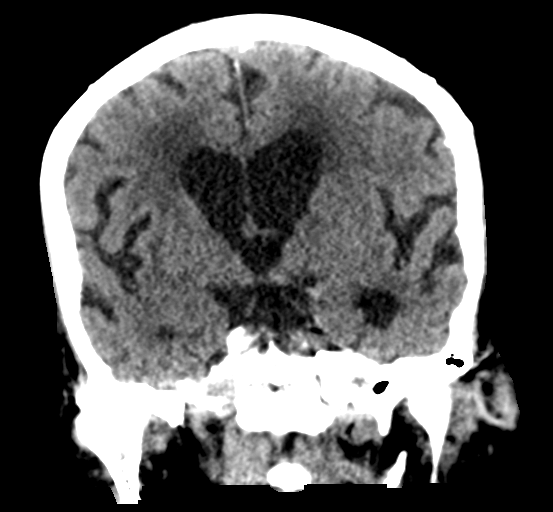

[Series 5: head without sag · sagittal · non-contrast · 0.33mm/px · 3 of 50 slices shown]
[im 17/50  brain]
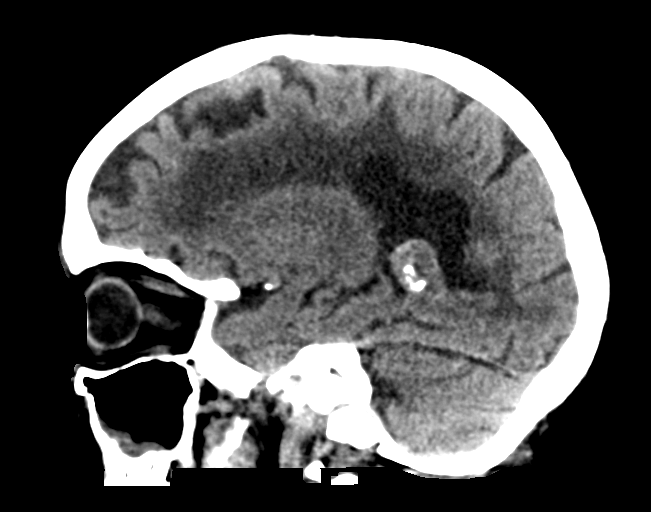
[im 25/50  brain]
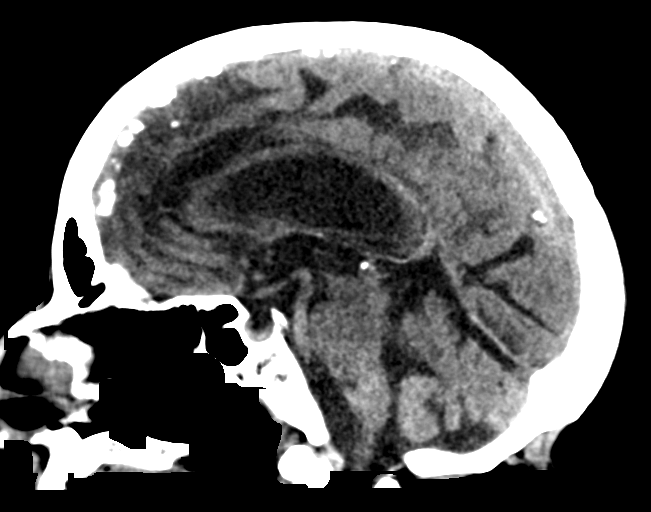
[im 33/50  brain]
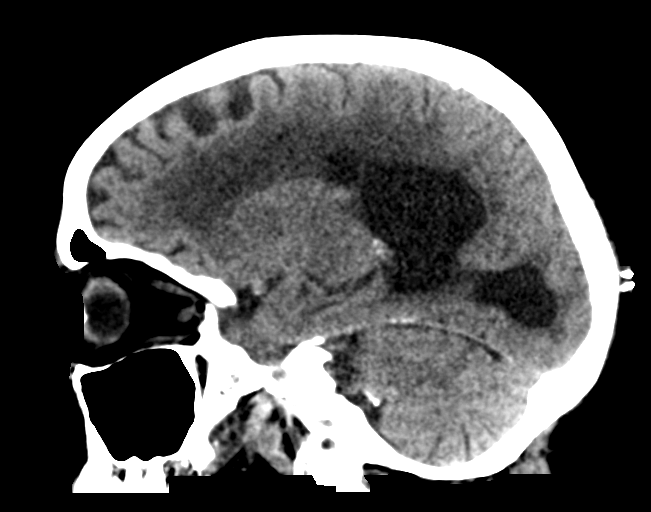

[16 of 47 positions shown; findings below may reference images not displayed]

FINDINGS: Brain: No posttraumatic finding including hemorrhage or swelling. No
acute infarct or obstructive hydrocephalus.

Notably bulky glomus of the choroid plexus, but bilateral and
unlikely to reflect underlying mass. There is advanced atrophy with
ventriculomegaly. Chronic ischemic injury with remote small infarcts
in the bilateral cerebellum and remote moderate infarct centered in
the right parietal cortex.

Vascular: Atherosclerotic calcification.

Skull: Posterior left scalp laceration with stable. No evidence of
underlying fracture or opaque foreign body. Chronic appearing
bilateral nasal arch deformities without overlying soft tissue
swelling.

Sinuses/Orbits: Bilateral cataract resection and senescent
calcification.
IMPRESSION: No acute finding or change from yesterday.
